# Patient Record
Sex: Female | Born: 1935
Health system: Southern US, Community
[De-identification: ages and names within clinical notes are randomized; demographics above are authoritative.]

## PROBLEM LIST (undated history)

## (undated) DIAGNOSIS — I10 Essential (primary) hypertension: Secondary | ICD-10-CM

## (undated) DIAGNOSIS — F1010 Alcohol abuse, uncomplicated: Secondary | ICD-10-CM

## (undated) DIAGNOSIS — M1990 Unspecified osteoarthritis, unspecified site: Secondary | ICD-10-CM

## (undated) DIAGNOSIS — W19XXXA Unspecified fall, initial encounter: Secondary | ICD-10-CM

## (undated) DIAGNOSIS — S2239XA Fracture of one rib, unspecified side, initial encounter for closed fracture: Secondary | ICD-10-CM

## (undated) DIAGNOSIS — IMO0001 Reserved for inherently not codable concepts without codable children: Secondary | ICD-10-CM

## (undated) DIAGNOSIS — F10121 Alcohol abuse with intoxication delirium: Secondary | ICD-10-CM

## (undated) DIAGNOSIS — J90 Pleural effusion, not elsewhere classified: Secondary | ICD-10-CM

## (undated) HISTORY — DX: Alcohol abuse, uncomplicated: F10.10

## (undated) HISTORY — DX: Fracture of one rib, unspecified side, initial encounter for closed fracture: S22.39XA

## (undated) HISTORY — DX: Unspecified fall, initial encounter: W19.XXXA

## (undated) HISTORY — PX: ABDOMINAL HYSTERECTOMY: SHX81

## (undated) HISTORY — PX: THORACENTESIS: SHX235

---

## 2012-04-23 ENCOUNTER — Emergency Department: Payer: Self-pay | Admitting: Emergency Medicine

## 2013-02-18 ENCOUNTER — Emergency Department: Payer: Self-pay | Admitting: Emergency Medicine

## 2013-02-20 ENCOUNTER — Inpatient Hospital Stay: Payer: Self-pay | Admitting: Internal Medicine

## 2013-02-20 LAB — URINALYSIS, COMPLETE
Bacteria: NONE SEEN
Bilirubin,UR: NEGATIVE
Ketone: NEGATIVE
Protein: NEGATIVE
RBC,UR: 7 /HPF (ref 0–5)
Specific Gravity: 1.002 (ref 1.003–1.030)
Squamous Epithelial: 1
WBC UR: 10 /HPF (ref 0–5)

## 2013-02-20 LAB — COMPREHENSIVE METABOLIC PANEL
Alkaline Phosphatase: 80 U/L (ref 50–136)
Anion Gap: 8 (ref 7–16)
BUN: 7 mg/dL (ref 7–18)
Bilirubin,Total: 1.1 mg/dL — ABNORMAL HIGH (ref 0.2–1.0)
Calcium, Total: 8.6 mg/dL (ref 8.5–10.1)
Co2: 26 mmol/L (ref 21–32)
Creatinine: 0.71 mg/dL (ref 0.60–1.30)
EGFR (African American): >60
EGFR (Non-African Amer.): >60
Osmolality: 249 (ref 275–301)
SGOT(AST): 53 U/L — ABNORMAL HIGH (ref 15–37)
SGPT (ALT): 37 U/L (ref 12–78)
Total Protein: 6.9 g/dL (ref 6.4–8.2)

## 2013-02-20 LAB — CBC
HCT: 30.9 % — ABNORMAL LOW (ref 35.0–47.0)
HGB: 11 g/dL — ABNORMAL LOW (ref 12.0–16.0)
MCH: 34.6 pg — ABNORMAL HIGH (ref 26.0–34.0)
MCHC: 35.5 g/dL (ref 32.0–36.0)
MCV: 97 fL (ref 80–100)
Platelet: 186 10*3/uL (ref 150–440)
RBC: 3.18 10*6/uL — ABNORMAL LOW (ref 3.80–5.20)
RDW: 12.7 % (ref 11.5–14.5)

## 2013-02-21 LAB — BASIC METABOLIC PANEL
BUN: 6 mg/dL — ABNORMAL LOW (ref 7–18)
Chloride: 103 mmol/L (ref 98–107)
EGFR (Non-African Amer.): >60
Glucose: 96 mg/dL (ref 65–99)
Osmolality: 269 (ref 275–301)
Potassium: 3.2 mmol/L — ABNORMAL LOW (ref 3.5–5.1)
Sodium: 136 mmol/L (ref 136–145)

## 2014-07-26 NOTE — H&P (Signed)
PATIENT NAME:  Lisa Chan, GUDIEL MR#:  [id number] DATE OF BIRTH:  Nov 10, 1926  DATE OF ADMISSION:  02/20/2013  PRIMARY CARE PHYSICIAN: Does not have one.   CHIEF COMPLAINT: Nausea, vomiting and altered mental status.   HISTORY OF PRESENT ILLNESS: This is an 81 year old female who presents to the Emergency Room, brought in by her daughter, due to altered mental status. As per the daughter, the patient is usually alert, awake, oriented and lives by herself. She recently fell a few days ago and had a left humeral fracture. She was discharged on some oral Percocet for the pain from the left humeral fracture. The patient developed significant nausea and vomiting shortly after she took her Percocet. Her daughter cut the Percocet in half, but her symptoms did not improve. This morning, when the daughter came to see her, she was a bit disoriented and could not recognize her. She brought her to the ER for further evaluation. The patient was noted to be acutely hyponatremic with a sodium of 124, also noted to have a urinary tract infection. Hospitalist services were contacted for further treatment and evaluation.   REVIEW OF SYSTEMS:  CONSTITUTIONAL: No documented fever. No weight gain, no weight loss.  EYES: No blurred or double vision.  ENT: No tinnitus. No postnasal drip. No redness of the oropharynx.  RESPIRATORY: No cough, no wheeze, no hemoptysis, no dyspnea.  CARDIOVASCULAR: No chest pain, no orthopnea, no palpitations, no syncope.  GASTROINTESTINAL: Positive nausea. Positive vomiting. No diarrhea. No abdominal pain. No melena or hematochezia.  GENITOURINARY: No dysuria. No hematuria.  ENDOCRINE: No polyuria or nocturia. No heat or cold intolerance.  HEMATOLOGIC: No anemia, no bruising, no bleeding.  INTEGUMENTARY: No rashes or lesions.  MUSCULOSKELETAL: No arthritis. No swelling. No gout.  NEUROLOGIC: No numbness. No tingling. No ataxia. No seizure-type activity.  PSYCHIATRIC: No anxiety, no  insomnia, no ADD.   PAST MEDICAL HISTORY: The patient has no significant past medical history.   ALLERGIES: No known drug allergies.   SOCIAL HISTORY: No smoking. Occasional alcohol use. No illicit drug abuse. Lives at home by herself.   FAMILY HISTORY: Both mother and father are deceased. Mother died from complications of heart disease. Father died from old age.   CURRENT MEDICATIONS: The patient is currently on no medications.   PHYSICAL EXAMINATION: Presently, is as follows:  VITAL SIGNS: Noted to be: Temperature is 98.4, pulse 101, respirations 18, blood pressure 140/65, saturation is 95% on room air.  GENERAL: She is a pleasant-appearing female in no apparent distress.  HEAD, EYES, EARS, NOSE AND THROAT: The patient does have a bruise above her left eye from the recent fall. Otherwise, normocephalic. Extraocular muscles are intact. Pupils are equal and reactive to light. Sclerae are anicteric. No conjunctival injection. No pharyngeal erythema.  NECK: Supple. There is no jugular venous distention. No bruits. No lymphadenopathy, no thyromegaly.  HEART: Regular rate and rhythm, tachycardic. No murmurs, no rubs, no clicks.  LUNGS: Clear to auscultation bilaterally. No rales or rhonchi. No wheezes.  ABDOMEN: Soft, flat, nontender, nondistended. Has good bowel sounds. No hepatosplenomegaly appreciated.  EXTREMITIES: No evidence of any cyanosis, clubbing or peripheral edema. Has +2 pedal and radial pulses bilaterally. The patient's left upper extremity is in a sling from a humeral fracture.  NEUROLOGICAL: She is alert, awake and oriented x3. No focal motor or sensory deficits appreciated bilaterally.  SKIN: Moist and warm with no rashes appreciated.  LYMPHATIC: There is no cervical or axillary lymphadenopathy.   LABORATORY DATA:  Shows serum glucose of 127, BUN 7, creatinine 0.7, sodium 124, potassium 3.5, chloride 90, bicarbonate 26. LFTs are within normal limits. Troponin less than 0.02.  White cell count 6.6, hemoglobin 11, hematocrit 30.9, platelet count of 186. Urinalysis shows 1+ leukocyte esterase with 10 white cells with no bacteria. The patient did have a CT of the head done without contrast which showed no acute intracranial process.   ASSESSMENT AND PLAN: This is an 81 year old female with a history of recent fall and left humeral fracture, who presents to the hospital due to altered mental status and also having nausea and vomiting now for the past 2 to 3 days.   1. Altered mental status. This is likely metabolic encephalopathy from a mild urinary tract infection and underlying hyponatremia. There is no acute neurologic source. The patient's CT head is negative. I will hydrate her with IV fluids to correct her sodium, give her IV ceftriaxone for the urinary tract infection and follow her mental status. Follow q.4 hour neuro checks. 2. Acute hyponatremia. This is likely hypovolemic hyponatremia due to the nausea and vomiting. I will hydrate her with IV fluids, follow her sodium.  3. Nausea, vomiting. This is likely related to the Percocet she was taking for her left humeral fracture. I will hold her Percocet for now and give her some Tylenol for the pain. Supportive care with IV fluids and antiemetics for now.  4. Urinary tract infection. Continue IV ceftriaxone, follow urine cultures. 5. Left humeral fracture. The patient's arm is in a sling already. The patient has outpatient followup appointment with Dr. Mack Guise this coming Friday.   CODE STATUS: The patient is a full code.   TIME SPENT ON ADMISSION: 50 minutes.   ____________________________ Belia Heman. Verdell Carmine, MD vjs:lb D: 02/20/2013 13:56:08 ET T: 02/20/2013 14:08:55 ET JOB#: [id number]  cc: Belia Heman. Verdell Carmine, MD, <Dictator> Henreitta Leber MD ELECTRONICALLY SIGNED 02/21/2013 13:49

## 2014-07-26 NOTE — Discharge Summary (Signed)
PATIENT NAME:  Lisa Chan, Lisa Chan MR#:  [id number] DATE OF BIRTH:  24-Dec-1926  DATE OF ADMISSION:  02/20/2013 DATE OF DISCHARGE:  02/21/2013  DISCHARGE DIAGNOSES:  1. Metabolic encephalopathy, likely from urinary tract infection/hyponatremia/narcotics, now back to her baseline.  2. Acute hyponatremia, likely hypovolemic hyponatremia due to nausea and vomiting, now resolved with hydration.  3. Nausea and vomiting, likely due to Percocet which is stopped now, and she is tolerating diet. 4. Urinary tract infection, uncomplicated, improving with antibiotics.  5. Left humeral fracture in sling. She will follow up with Dr. Mack Guise this coming Friday as scheduled.   SECONDARY DIAGNOSIS: None.   CONSULTATIONS: None.   PROCEDURES AND RADIOLOGY:  left shoulder x-ray on the 16th of November showed continued impacted fracture involving the anatomic neck of the humerus.    CT scan of the head without contrast on 18th of November showed no acute intracranial pathology. There is small vessel disease present.   Major laboratory panel: UA on admission showed 10 WBCs, 1+ leuk esterase, no bacteria.   HISTORY AND SHORT HOSPITAL COURSE: The patient is an 81 year old female with the above-mentioned medical problems. Was admitted for metabolic encephalopathy, thought to be secondary to UTI and/or hyponatremia and likely from Percocet. The patient's hyponatremia was thought to be due to dehydration from nausea and vomiting, likely secondary to Percocet which was prescribed for recent left humerus fracture for pain control. Please see Dr. Edward Jolly dictated history and physical for further details. The patient was doing much better. Her sodium had normalized with IV hydration. She did not have any further nausea or vomiting, and her mental status was back to baseline after stopping Percocet. On the date of discharge, her vital signs were as follows: Temperature 98.4, heart rate 93 per minute, respirations 18 per minute,  blood pressure 132/66 mmHg. She was saturating 96% on room air.   PERTINENT PHYSICAL EXAMINATION ON THE DATE OF DISCHARGE:  CARDIOVASCULAR: S1, S2 normal. No murmurs, rubs or gallops.  LUNGS: Clear to auscultation bilaterally. No wheezing, rales, rhonchi or crepitation.  ABDOMEN: Soft, benign.  NEUROLOGIC: Nonfocal examination.   All other physical examination remained at baseline.   DISCHARGE MEDICATIONS:  1. Tylenol 650 mg p.o. every 6 hours as needed.  2. Levaquin 250 mg p.o. daily for 3 days.   DISCHARGE DIET: Regular.   DISCHARGE ACTIVITY: As tolerated.   DISCHARGE INSTRUCTIONS AND FOLLOWUP: The patient was instructed to follow up with a new primary care physician at [facility] in [place] if needed in 1 to 2 weeks. She will need followup with Dr. Mack Guise from orthopedics on this Friday as scheduled on November 21st.   TOTAL TIME DISCHARGING THIS PATIENT: 45 minutes.    ____________________________ Lucina Mellow. Manuella Ghazi, MD vss:gb D: 02/21/2013 21:30:04 ET T: 02/21/2013 22:26:46 ET JOB#: [id number]  cc: Ajanay Farve S. Manuella Ghazi, MD, <Dictator> [facility] Timoteo Gaul, MD Lucina Mellow [facility] MD ELECTRONICALLY SIGNED 02/22/2013 16:24

## 2015-10-03 ENCOUNTER — Emergency Department
Admission: EM | Admit: 2015-10-03 | Discharge: 2015-10-04 | Payer: Medicare PPO | Attending: Emergency Medicine | Admitting: Emergency Medicine

## 2015-10-03 ENCOUNTER — Inpatient Hospital Stay: Admit: 2015-10-03 | Payer: Self-pay

## 2015-10-03 ENCOUNTER — Emergency Department: Payer: Medicare PPO

## 2015-10-03 ENCOUNTER — Encounter: Payer: Self-pay | Admitting: Emergency Medicine

## 2015-10-03 DIAGNOSIS — Y999 Unspecified external cause status: Secondary | ICD-10-CM | POA: Diagnosis not present

## 2015-10-03 DIAGNOSIS — W010XXA Fall on same level from slipping, tripping and stumbling without subsequent striking against object, initial encounter: Secondary | ICD-10-CM | POA: Diagnosis not present

## 2015-10-03 DIAGNOSIS — S270XXA Traumatic pneumothorax, initial encounter: Secondary | ICD-10-CM | POA: Diagnosis not present

## 2015-10-03 DIAGNOSIS — M1990 Unspecified osteoarthritis, unspecified site: Secondary | ICD-10-CM | POA: Diagnosis not present

## 2015-10-03 DIAGNOSIS — S299XXA Unspecified injury of thorax, initial encounter: Secondary | ICD-10-CM | POA: Diagnosis not present

## 2015-10-03 DIAGNOSIS — R51 Headache: Secondary | ICD-10-CM | POA: Insufficient documentation

## 2015-10-03 DIAGNOSIS — J939 Pneumothorax, unspecified: Secondary | ICD-10-CM | POA: Insufficient documentation

## 2015-10-03 DIAGNOSIS — S2242XA Multiple fractures of ribs, left side, initial encounter for closed fracture: Secondary | ICD-10-CM | POA: Diagnosis not present

## 2015-10-03 DIAGNOSIS — S3991XA Unspecified injury of abdomen, initial encounter: Secondary | ICD-10-CM | POA: Diagnosis not present

## 2015-10-03 DIAGNOSIS — Y92009 Unspecified place in unspecified non-institutional (private) residence as the place of occurrence of the external cause: Secondary | ICD-10-CM | POA: Insufficient documentation

## 2015-10-03 DIAGNOSIS — M549 Dorsalgia, unspecified: Secondary | ICD-10-CM | POA: Diagnosis not present

## 2015-10-03 DIAGNOSIS — S2232XA Fracture of one rib, left side, initial encounter for closed fracture: Secondary | ICD-10-CM

## 2015-10-03 DIAGNOSIS — Y939 Activity, unspecified: Secondary | ICD-10-CM | POA: Diagnosis not present

## 2015-10-03 DIAGNOSIS — R109 Unspecified abdominal pain: Secondary | ICD-10-CM | POA: Insufficient documentation

## 2015-10-03 DIAGNOSIS — W19XXXA Unspecified fall, initial encounter: Secondary | ICD-10-CM

## 2015-10-03 DIAGNOSIS — S0990XA Unspecified injury of head, initial encounter: Secondary | ICD-10-CM | POA: Diagnosis not present

## 2015-10-03 HISTORY — DX: Unspecified osteoarthritis, unspecified site: M19.90

## 2015-10-03 LAB — BASIC METABOLIC PANEL
Anion gap: 15 (ref 5–15)
BUN: 7 mg/dL (ref 6–20)
CO2: 24 mmol/L (ref 22–32)
CREATININE: 0.54 mg/dL (ref 0.44–1.00)
Calcium: 8.8 mg/dL — ABNORMAL LOW (ref 8.9–10.3)
Chloride: 94 mmol/L — ABNORMAL LOW (ref 101–111)
Glucose, Bld: 143 mg/dL — ABNORMAL HIGH (ref 65–99)
Potassium: 3.6 mmol/L (ref 3.5–5.1)
SODIUM: 133 mmol/L — AB (ref 135–145)

## 2015-10-03 LAB — CBC WITH DIFFERENTIAL/PLATELET
BASOS PCT: 0 %
Basophils Absolute: 0 10*3/uL (ref 0–0.1)
EOS ABS: 0 10*3/uL (ref 0–0.7)
EOS PCT: 0 %
HCT: 35.1 % (ref 35.0–47.0)
Hemoglobin: 12.2 g/dL (ref 12.0–16.0)
LYMPHS ABS: 0.7 10*3/uL — AB (ref 1.0–3.6)
Lymphocytes Relative: 7 %
MCH: 35.3 pg — AB (ref 26.0–34.0)
MCHC: 34.6 g/dL (ref 32.0–36.0)
MCV: 102.1 fL — ABNORMAL HIGH (ref 80.0–100.0)
MONOS PCT: 6 %
Monocytes Absolute: 0.6 10*3/uL (ref 0.2–0.9)
NEUTROS ABS: 8.9 10*3/uL — AB (ref 1.4–6.5)
NEUTROS PCT: 87 %
PLATELETS: 239 10*3/uL (ref 150–440)
RBC: 3.44 MIL/uL — AB (ref 3.80–5.20)
RDW: 14.5 % (ref 11.5–14.5)
WBC: 10.1 10*3/uL (ref 3.6–11.0)

## 2015-10-03 LAB — TROPONIN I: Troponin I: <0.03 ng/mL (ref <0.03)

## 2015-10-03 MED ORDER — MORPHINE SULFATE (PF) 2 MG/ML IV SOLN
2.0000 mg | Freq: Once | INTRAVENOUS | Status: AC
  Administered 2015-10-03: 2 mg via INTRAVENOUS

## 2015-10-03 MED ORDER — MORPHINE SULFATE (PF) 2 MG/ML IV SOLN
INTRAVENOUS | Status: AC
  Filled 2015-10-03: qty 1

## 2015-10-03 MED ORDER — SODIUM CHLORIDE 0.9 % IV BOLUS (SEPSIS)
500.0000 mL | Freq: Once | INTRAVENOUS | Status: AC
  Administered 2015-10-03: 500 mL via INTRAVENOUS

## 2015-10-03 MED ORDER — MORPHINE SULFATE (PF) 2 MG/ML IV SOLN
INTRAVENOUS | Status: AC
  Administered 2015-10-03: 2 mg via INTRAVENOUS
  Filled 2015-10-03: qty 1

## 2015-10-03 MED ORDER — MORPHINE SULFATE (PF) 2 MG/ML IV SOLN: 1.0000 mg | Freq: Once | INTRAVENOUS | Status: DC

## 2015-10-03 MED ORDER — LIDOCAINE-EPINEPHRINE (PF) 1 %-1:200000 IJ SOLN
INTRAMUSCULAR | Status: AC
  Filled 2015-10-03: qty 30

## 2015-10-03 MED ORDER — IOPAMIDOL (ISOVUE-370) INJECTION 76%
75.0000 mL | Freq: Once | INTRAVENOUS | Status: AC | PRN
  Administered 2015-10-03: 75 mL via INTRAVENOUS

## 2015-10-03 NOTE — ED Provider Notes (Signed)
[facility] Emergency Department Provider Note    ____________________________________________  Time seen: ~2140  I have reviewed the triage vital signs and the nursing notes.   HISTORY  Chief Complaint Fall   History limited by: Not Limited   HPI Lisa Chan is a 82 y.o. female presents from home today after a fall. Family states that the patient does live alone. She has had frequent falls recently. It is unclear the etiology of these falls. The patient will not say whether she thinks she trips or this is her bowels. She simply states that is because she is getting old. Does sound like the patient has a lot of furniture around the house that she uses for balance. She does not have a cane or walker. Patient denied passing out with today's fall. She denied any concurrent chest pain palpitations or shortness of breath. Patient's primary complaint is of left thorax pain. Family did note a new bruise to that area. Family also points out bruises to the right shoulder and right upper chest which were from a previous fall.    Past Medical History  Diagnosis Date  . Arthritis     There are no active problems to display for this patient.   Past Surgical History  Procedure Laterality Date  . Abdominal hysterectomy      No current outpatient prescriptions on file.  Allergies Review of patient's allergies indicates no known allergies.  History reviewed. No pertinent family history.  Social History Social History  Substance Use Topics  . Smoking status: Never Smoker   . Smokeless tobacco: None  . Alcohol Use: 12.6 oz/week    21 Shots of liquor per week    Review of Systems  Constitutional: Negative for fever. Cardiovascular: Negative for chest pain. Respiratory: Negative for shortness of breath. Gastrointestinal: Negative for abdominal pain, vomiting and diarrhea. Genitourinary: Negative for dysuria. Musculoskeletal: Negative for back pain.  Positive for left thorax pain. Skin: Negative for rash. Neurological: Negative for headaches, focal weakness or numbness.  10-point ROS otherwise negative.  ____________________________________________   PHYSICAL EXAM:  VITAL SIGNS: ED Triage Vitals  Enc Vitals Group     BP 10/03/15 2100 175/100 mmHg     Pulse Rate 10/03/15 2100 108     Resp 10/03/15 2100 18     Temp 10/03/15 2100 97.7 F (36.5 C)     Temp Source 10/03/15 2100 Oral     SpO2 10/03/15 2100 93 %   Constitutional: Alert and oriented. Well appearing and in no distress. Thin. Eyes: Conjunctivae are normal. PERRL. Normal extraocular movements. ENT   Head: Normocephalic and atraumatic.   Nose: No congestion/rhinnorhea.   Mouth/Throat: Mucous membranes are moist.   Neck: No stridor. Hematological/Lymphatic/Immunilogical: No cervical lymphadenopathy. Cardiovascular: Normal rate, regular rhythm.  No murmurs, rubs, or gallops. Left chest wall tender to palpitation.  Respiratory: Normal respiratory effort without tachypnea nor retractions. Breath sounds are clear and equal bilaterally. No wheezes/rales/rhonchi. Gastrointestinal: Soft and nontender. No distention. There is no CVA tenderness. Genitourinary: Deferred Musculoskeletal: Normal range of motion in all extremities. No spinal tenderness. Hips without tenderness. Pelvis stable. No joint effusions.  No lower extremity tenderness nor edema. Neurologic:  Normal speech and language. No gross focal neurologic deficits are appreciated.  Skin:  Older appearing ecchymosis over the right shoulder and upper chest. Ecchymosis to the left thorax.  Psychiatric: Mood and affect are normal. Speech and behavior are normal. Patient exhibits appropriate insight and judgment.  ____________________________________________  LABS (pertinent positives/negatives)  Labs Reviewed  CBC WITH DIFFERENTIAL/PLATELET - Abnormal; Notable for the following:    RBC 3.44 (*)    MCV  102.1 (*)    MCH 35.3 (*)    Neutro Abs 8.9 (*)    Lymphs Abs 0.7 (*)    All other components within normal limits  BASIC METABOLIC PANEL - Abnormal; Notable for the following:    Sodium 133 (*)    Chloride 94 (*)    Glucose, Bld 143 (*)    Calcium 8.8 (*)    All other components within normal limits  TROPONIN I  URINALYSIS COMPLETEWITH MICROSCOPIC ([facility] ONLY)     ____________________________________________   EKG  None  ____________________________________________    RADIOLOGY  CXR IMPRESSION: 1. Left pneumothorax, approximately 20% by volume. 2. Extensive left subcutaneous emphysema. 3. Numerous left rib fractures, displaced and overriding.  CT chest/abd/pel IMPRESSION: 1. Acute displaced fractures of the left second through eighth ribs. Moderate left pneumothorax, 15-20% by volume. 2. Small left pleural effusion and left lower lobe atelectasis or contusion. 3. No acute traumatic injury in the abdomen or pelvis. 4. Moderate compression of L1, indeterminate age but more likely remote. 5. Chronic appearing compressions of T9 and L5. Remote healed left pubic ramus fracture deformities. Remote posttraumatic deformities of the proximal humerus bilaterally, incompletely united on the Left.  CT head IMPRESSION: No acute intracranial hemorrhage.  Age-related atrophy and chronic microvascular ischemic disease.   ____________________________________________   PROCEDURES  Procedure(s) performed: None  Critical Care performed: No  ____________________________________________   INITIAL IMPRESSION / ASSESSMENT AND PLAN / ED COURSE  Pertinent labs & imaging results that were available during my care of the patient were reviewed by me and considered in my medical decision making (see chart for details).  Patient presented to the emergency department today after a fall. Patient complained primarily of left-sided rib pain. Chest x-ray does show a left  pneumothorax with multiple rib fractures. I did have surgery evaluate the patient. Recommended CT of the chest and abdomen which was performed. This again showed the multiple rib fractures and pneumothorax without any other significant traumatic injury. Given the number of rib fractures and pneumothorax hour the patient was arranged to be transferred to [facility] so she can be under the care of trauma surgery. Prior to transport Dr. Rosalia Hammers with surgery placed a chest tube.  ____________________________________________   FINAL CLINICAL IMPRESSION(S) / ED DIAGNOSES  Final diagnoses:  Fall, initial encounter  Left rib fracture, closed, initial encounter  Pneumothorax     Note: This dictation was prepared with Dragon dictation. Any transcriptional errors that result from this process are unintentional    Nance Pear, MD 10/04/15 [phone]

## 2015-10-03 NOTE — ED Notes (Signed)
Gave report to [facility], they are 5 minutes out.

## 2015-10-03 NOTE — H&P (Signed)
Patient ID: Lisa Chan, female   DOB: 1926/12/05, 82 y.o.   MRN: [id number]  History of Present Illness Lisa Chan is a 82 y.o. female with a history of two falls earlier today, from standing, landing on her back and left flank.  She denies LOC.  She reports she tripped on the carpet, mechanical fall.  Denies any presyncopal symptoms.  Complains now of back and left flank pain.  Denies SOB, denies CP.  Denies abdominal pain.  Not on any blood thinners.  Lives home alone in a single level home.    Past Medical History Past Medical History  Diagnosis Date  . Arthritis        Past Surgical History  Procedure Laterality Date  . Abdominal hysterectomy      No Known Allergies  Current Facility-Administered Medications  Medication Dose Route Frequency Provider Last Rate Last Dose  . sodium chloride 0.9 % bolus 500 mL  500 mL Intravenous Once Nance Pear, MD       No current outpatient prescriptions on file.    Family History History reviewed. No pertinent family history.     Social History Social History  Substance Use Topics  . Smoking status: Never Smoker   . Smokeless tobacco: None  . Alcohol Use: 12.6 oz/week    21 Shots of liquor per week        ROS As per HPI otherwise 12 pt ROS is negative  Physical Exam Blood pressure 175/100, pulse 108, temperature 97.7 F (36.5 C), temperature source Oral, resp. rate 18, SpO2 93 %.  CONSTITUTIONAL: alert and oriented, NAD EYES: Pupils equal, round, and reactive to light, Sclera non-icteric. EARS, NOSE, MOUTH AND THROAT: The oropharynx is clear. Oral mucosa is pink and moist. Hearing is intact to voice.  NECK: Trachea is midline, and there is no jugular venous distension. Thyroid is without palpable abnormalities. LYMPH NODES:  Lymph nodes in the neck are not enlarged. RESPIRATORY:  Lungs are clear, and breath sounds are diminished on the elft anteriorly. Normal respiratory effort without pathologic use of accessory  muscles. CARDIOVASCULAR: Heart is regular without murmurs, gallops, or rubs.  TTP left chest wall GI: The abdomen is  soft, nontender, and nondistended. There were no palpable masses. There was no hepatosplenomegaly. There were normal bowel sounds. GU: Deferred MUSCULOSKELETAL:  Normal muscle strength and tone in all four extremities.    SKIN: Skin turgor is normal. There are no pathologic skin lesions. Crepitus on left chest wall NEUROLOGIC:  Motor and sensation is grossly normal.  Cranial nerves are grossly intact. PSYCH:  Alert and oriented to person, place and time. Affect is normal.  Data Reviewed CXR, CT C/A/P: 5 left sided rib fractures, markedly displaced, moderate pneumothorax, moderate hemothorax, left pulmonary contusion.  Final read pending, on my read no obvious intraabdominal injury, no obvious splenic laceration or hemoperitoneum  I have personally reviewed the patient's imaging and medical records.    Assessment    69 F with significant left chest wall traumatic injury with markedly displaced rib fractures and mixed hemo/pneumothorax  Plan   Patient currently without respiratory distress, O2 saturation in upper 90s on 4LNC.  Due to the significant risk of pulmonary decline in setting of pulmonary contusion, with patients advanced age and minimal physiologic reserve, with this traumatic injury, recommending transfer to a trauma center for further evaluation and management.  Will hold off on chest tube at this time as injury was ~6 hrs ago and is not having  respiratory decompensation at this time  Face-to-face time spent with the patient and care providers was 30 minutes, with more than 50% of the time spent counseling, educating, and coordinating care of the patient.     Mali Jourdan Durbin 10/03/2015, 11:06 PM

## 2015-10-03 NOTE — ED Notes (Signed)
Per EMS, patient had unwitnessed fall at home on left side.  Patient has contusion on left side of her back that she says is there because of her fall.  She also has bruising on right side of body that goes front to back.  She has no memory of how that happened.  Tonight she presents in NAD and is AOx4.  Patient denies weakness, dizziness or LOC as related to her fall.

## 2015-10-03 NOTE — ED Notes (Signed)
Patient consistently in mid 80's O2 saturation, putting on 4L nasal cannula, she went to 96%.

## 2015-10-04 ENCOUNTER — Inpatient Hospital Stay (HOSPITAL_COMMUNITY): Payer: Medicare PPO

## 2015-10-04 ENCOUNTER — Inpatient Hospital Stay (HOSPITAL_COMMUNITY)
Admission: AD | Admit: 2015-10-04 | Discharge: 2015-10-12 | DRG: 200 | Disposition: A | Discharge status: Home Health | Payer: Medicare PPO | Source: Other Acute Inpatient Hospital | Attending: General Surgery | Admitting: General Surgery

## 2015-10-04 ENCOUNTER — Emergency Department: Payer: Medicare PPO

## 2015-10-04 DIAGNOSIS — S2242XA Multiple fractures of ribs, left side, initial encounter for closed fracture: Secondary | ICD-10-CM | POA: Diagnosis not present

## 2015-10-04 DIAGNOSIS — J939 Pneumothorax, unspecified: Secondary | ICD-10-CM

## 2015-10-04 DIAGNOSIS — M1990 Unspecified osteoarthritis, unspecified site: Secondary | ICD-10-CM | POA: Diagnosis present

## 2015-10-04 DIAGNOSIS — I4891 Unspecified atrial fibrillation: Secondary | ICD-10-CM

## 2015-10-04 DIAGNOSIS — S51011A Laceration without foreign body of right elbow, initial encounter: Secondary | ICD-10-CM | POA: Diagnosis not present

## 2015-10-04 DIAGNOSIS — L989 Disorder of the skin and subcutaneous tissue, unspecified: Secondary | ICD-10-CM | POA: Diagnosis present

## 2015-10-04 DIAGNOSIS — E039 Hypothyroidism, unspecified: Secondary | ICD-10-CM | POA: Diagnosis present

## 2015-10-04 DIAGNOSIS — Z681 Body mass index (BMI) 19 or less, adult: Secondary | ICD-10-CM | POA: Diagnosis not present

## 2015-10-04 DIAGNOSIS — W19XXXA Unspecified fall, initial encounter: Secondary | ICD-10-CM | POA: Diagnosis present

## 2015-10-04 DIAGNOSIS — Z781 Physical restraint status: Secondary | ICD-10-CM | POA: Diagnosis not present

## 2015-10-04 DIAGNOSIS — E876 Hypokalemia: Secondary | ICD-10-CM | POA: Diagnosis not present

## 2015-10-04 DIAGNOSIS — S2242XG Multiple fractures of ribs, left side, subsequent encounter for fracture with delayed healing: Secondary | ICD-10-CM

## 2015-10-04 DIAGNOSIS — R2231 Localized swelling, mass and lump, right upper limb: Secondary | ICD-10-CM | POA: Diagnosis present

## 2015-10-04 DIAGNOSIS — W010XXA Fall on same level from slipping, tripping and stumbling without subsequent striking against object, initial encounter: Secondary | ICD-10-CM | POA: Diagnosis present

## 2015-10-04 DIAGNOSIS — S0990XA Unspecified injury of head, initial encounter: Secondary | ICD-10-CM | POA: Diagnosis not present

## 2015-10-04 DIAGNOSIS — I491 Atrial premature depolarization: Secondary | ICD-10-CM | POA: Diagnosis present

## 2015-10-04 DIAGNOSIS — R109 Unspecified abdominal pain: Secondary | ICD-10-CM | POA: Diagnosis not present

## 2015-10-04 DIAGNOSIS — S2249XA Multiple fractures of ribs, unspecified side, initial encounter for closed fracture: Secondary | ICD-10-CM | POA: Diagnosis present

## 2015-10-04 DIAGNOSIS — D62 Acute posthemorrhagic anemia: Secondary | ICD-10-CM | POA: Diagnosis not present

## 2015-10-04 DIAGNOSIS — S272XXA Traumatic hemopneumothorax, initial encounter: Secondary | ICD-10-CM | POA: Diagnosis present

## 2015-10-04 DIAGNOSIS — S2249XS Multiple fractures of ribs, unspecified side, sequela: Secondary | ICD-10-CM | POA: Diagnosis not present

## 2015-10-04 DIAGNOSIS — Z9689 Presence of other specified functional implants: Secondary | ICD-10-CM

## 2015-10-04 DIAGNOSIS — R1310 Dysphagia, unspecified: Secondary | ICD-10-CM | POA: Diagnosis not present

## 2015-10-04 DIAGNOSIS — R0781 Pleurodynia: Secondary | ICD-10-CM | POA: Diagnosis present

## 2015-10-04 DIAGNOSIS — T797XXA Traumatic subcutaneous emphysema, initial encounter: Secondary | ICD-10-CM | POA: Diagnosis not present

## 2015-10-04 DIAGNOSIS — Y92009 Unspecified place in unspecified non-institutional (private) residence as the place of occurrence of the external cause: Secondary | ICD-10-CM | POA: Diagnosis not present

## 2015-10-04 DIAGNOSIS — J9811 Atelectasis: Secondary | ICD-10-CM | POA: Diagnosis not present

## 2015-10-04 DIAGNOSIS — E440 Moderate protein-calorie malnutrition: Secondary | ICD-10-CM | POA: Diagnosis present

## 2015-10-04 DIAGNOSIS — I471 Supraventricular tachycardia: Secondary | ICD-10-CM | POA: Diagnosis not present

## 2015-10-04 DIAGNOSIS — I480 Paroxysmal atrial fibrillation: Secondary | ICD-10-CM | POA: Diagnosis not present

## 2015-10-04 DIAGNOSIS — Z66 Do not resuscitate: Secondary | ICD-10-CM | POA: Diagnosis present

## 2015-10-04 DIAGNOSIS — F1019 Alcohol abuse with unspecified alcohol-induced disorder: Secondary | ICD-10-CM | POA: Diagnosis not present

## 2015-10-04 DIAGNOSIS — W19XXXD Unspecified fall, subsequent encounter: Secondary | ICD-10-CM

## 2015-10-04 DIAGNOSIS — S2249XG Multiple fractures of ribs, unspecified side, subsequent encounter for fracture with delayed healing: Secondary | ICD-10-CM

## 2015-10-04 DIAGNOSIS — R269 Unspecified abnormalities of gait and mobility: Secondary | ICD-10-CM | POA: Diagnosis not present

## 2015-10-04 DIAGNOSIS — R000 Tachycardia, unspecified: Secondary | ICD-10-CM | POA: Diagnosis not present

## 2015-10-04 DIAGNOSIS — R410 Disorientation, unspecified: Secondary | ICD-10-CM | POA: Diagnosis not present

## 2015-10-04 DIAGNOSIS — F1010 Alcohol abuse, uncomplicated: Secondary | ICD-10-CM | POA: Diagnosis not present

## 2015-10-04 DIAGNOSIS — S270XXA Traumatic pneumothorax, initial encounter: Secondary | ICD-10-CM | POA: Diagnosis not present

## 2015-10-04 DIAGNOSIS — R51 Headache: Secondary | ICD-10-CM | POA: Diagnosis not present

## 2015-10-04 DIAGNOSIS — S299XXA Unspecified injury of thorax, initial encounter: Secondary | ICD-10-CM

## 2015-10-04 DIAGNOSIS — S2231XA Fracture of one rib, right side, initial encounter for closed fracture: Secondary | ICD-10-CM | POA: Diagnosis not present

## 2015-10-04 LAB — BASIC METABOLIC PANEL
Anion gap: 12 (ref 5–15)
CHLORIDE: 95 mmol/L — AB (ref 101–111)
CO2: 25 mmol/L (ref 22–32)
CREATININE: 0.57 mg/dL (ref 0.44–1.00)
Calcium: 8.1 mg/dL — ABNORMAL LOW (ref 8.9–10.3)
GFR calc Af Amer: >60 mL/min (ref >60)
GFR calc non Af Amer: >60 mL/min (ref >60)
GLUCOSE: 132 mg/dL — AB (ref 65–99)
Potassium: 3.8 mmol/L (ref 3.5–5.1)
Sodium: 132 mmol/L — ABNORMAL LOW (ref 135–145)

## 2015-10-04 LAB — URINALYSIS COMPLETE WITH MICROSCOPIC (ARMC ONLY)
BILIRUBIN URINE: NEGATIVE
Bacteria, UA: NONE SEEN
GLUCOSE, UA: NEGATIVE mg/dL
Leukocytes, UA: NEGATIVE
NITRITE: NEGATIVE
Protein, ur: NEGATIVE mg/dL
SPECIFIC GRAVITY, URINE: 1.026 (ref 1.005–1.030)
pH: 6 (ref 5.0–8.0)

## 2015-10-04 LAB — CBC
HCT: 29.7 % — ABNORMAL LOW (ref 36.0–46.0)
Hemoglobin: 9.9 g/dL — ABNORMAL LOW (ref 12.0–15.0)
MCH: 33.9 pg (ref 26.0–34.0)
MCHC: 33.3 g/dL (ref 30.0–36.0)
MCV: 101.7 fL — AB (ref 78.0–100.0)
PLATELETS: 194 10*3/uL (ref 150–400)
RBC: 2.92 MIL/uL — ABNORMAL LOW (ref 3.87–5.11)
RDW: 13.5 % (ref 11.5–15.5)
WBC: 7.1 10*3/uL (ref 4.0–10.5)

## 2015-10-04 LAB — MRSA PCR SCREENING: MRSA by PCR: NEGATIVE

## 2015-10-04 MED ORDER — LORAZEPAM 1 MG PO TABS
0.0000 mg | ORAL_TABLET | Freq: Two times a day (BID) | ORAL | Status: DC
Start: 2015-10-06 — End: 2015-10-07

## 2015-10-04 MED ORDER — ONDANSETRON HCL 4 MG/2ML IJ SOLN
4.0000 mg | Freq: Four times a day (QID) | INTRAMUSCULAR | Status: DC | PRN
  Administered 2015-10-04: 4 mg via INTRAVENOUS
  Filled 2015-10-04: qty 2

## 2015-10-04 MED ORDER — ONDANSETRON HCL 4 MG PO TABS: 4.0000 mg | ORAL_TABLET | Freq: Four times a day (QID) | ORAL | Status: DC | PRN

## 2015-10-04 MED ORDER — LORAZEPAM 1 MG PO TABS
0.0000 mg | ORAL_TABLET | Freq: Four times a day (QID) | ORAL | Status: AC
  Administered 2015-10-04 – 2015-10-05 (×6): 1 mg via ORAL
  Filled 2015-10-04 (×5): qty 1
  Filled 2015-10-04: qty 2

## 2015-10-04 MED ORDER — LIDOCAINE HCL (PF) 1 % IJ SOLN
INTRAMUSCULAR | Status: AC
  Filled 2015-10-04: qty 5

## 2015-10-04 MED ORDER — FENTANYL CITRATE (PF) 100 MCG/2ML IJ SOLN
INTRAMUSCULAR | Status: AC
  Filled 2015-10-04: qty 2

## 2015-10-04 MED ORDER — LIDOCAINE HCL (PF) 1 % IJ SOLN
INTRAMUSCULAR | Status: AC
  Filled 2015-10-04: qty 30

## 2015-10-04 MED ORDER — ACETAMINOPHEN 325 MG PO TABS
650.0000 mg | ORAL_TABLET | Freq: Four times a day (QID) | ORAL | Status: DC | PRN
  Administered 2015-10-07 – 2015-10-08 (×2): 650 mg via ORAL
  Filled 2015-10-04 (×2): qty 2

## 2015-10-04 MED ORDER — ENOXAPARIN SODIUM 40 MG/0.4ML ~~LOC~~ SOLN
40.0000 mg | SUBCUTANEOUS | Status: DC
  Administered 2015-10-04 – 2015-10-12 (×9): 40 mg via SUBCUTANEOUS
  Filled 2015-10-04 (×9): qty 0.4

## 2015-10-04 MED ORDER — LORAZEPAM 1 MG PO TABS: 1.0000 mg | ORAL_TABLET | Freq: Four times a day (QID) | ORAL | Status: DC | PRN

## 2015-10-04 MED ORDER — THIAMINE HCL 100 MG/ML IJ SOLN
100.0000 mg | Freq: Every day | INTRAMUSCULAR | Status: DC
  Administered 2015-10-04: 100 mg via INTRAVENOUS
  Filled 2015-10-04: qty 2

## 2015-10-04 MED ORDER — FOLIC ACID 1 MG PO TABS
1.0000 mg | ORAL_TABLET | Freq: Every day | ORAL | Status: DC
  Administered 2015-10-04 – 2015-10-12 (×9): 1 mg via ORAL
  Filled 2015-10-04 (×9): qty 1

## 2015-10-04 MED ORDER — FENTANYL CITRATE (PF) 100 MCG/2ML IJ SOLN
50.0000 ug | Freq: Once | INTRAMUSCULAR | Status: AC
  Administered 2015-10-04: 50 ug via INTRAVENOUS

## 2015-10-04 MED ORDER — TRAMADOL HCL 50 MG PO TABS
50.0000 mg | ORAL_TABLET | Freq: Four times a day (QID) | ORAL | Status: DC | PRN
  Administered 2015-10-05 – 2015-10-07 (×4): 50 mg via ORAL
  Filled 2015-10-04 (×4): qty 1

## 2015-10-04 MED ORDER — LORAZEPAM 2 MG/ML IJ SOLN: 1.0000 mg | Freq: Four times a day (QID) | INTRAMUSCULAR | Status: DC | PRN

## 2015-10-04 MED ORDER — MIDAZOLAM HCL 2 MG/2ML IJ SOLN
2.0000 mg | Freq: Once | INTRAMUSCULAR | Status: AC
  Administered 2015-10-04: 2 mg via INTRAVENOUS

## 2015-10-04 MED ORDER — VITAMIN B-1 100 MG PO TABS
100.0000 mg | ORAL_TABLET | Freq: Every day | ORAL | Status: DC
  Administered 2015-10-05 – 2015-10-12 (×8): 100 mg via ORAL
  Filled 2015-10-04 (×7): qty 1

## 2015-10-04 MED ORDER — FENTANYL CITRATE (PF) 100 MCG/2ML IJ SOLN
12.5000 ug | INTRAMUSCULAR | Status: DC | PRN
  Administered 2015-10-04 – 2015-10-07 (×7): 25 ug via INTRAVENOUS
  Filled 2015-10-04 (×7): qty 2

## 2015-10-04 MED ORDER — MIDAZOLAM HCL 2 MG/2ML IJ SOLN
INTRAMUSCULAR | Status: AC
  Filled 2015-10-04: qty 2

## 2015-10-04 MED ORDER — ADULT MULTIVITAMIN W/MINERALS CH
1.0000 | ORAL_TABLET | Freq: Every day | ORAL | Status: DC
  Administered 2015-10-04 – 2015-10-12 (×9): 1 via ORAL
  Filled 2015-10-04 (×9): qty 1

## 2015-10-04 MED ORDER — POTASSIUM CHLORIDE IN NACL 20-0.9 MEQ/L-% IV SOLN
INTRAVENOUS | Status: DC
  Administered 2015-10-04 – 2015-10-08 (×2): via INTRAVENOUS
  Administered 2015-10-08: 1000 mL via INTRAVENOUS
  Administered 2015-10-09: 75 mL/h via INTRAVENOUS
  Administered 2015-10-09: 22:00:00 via INTRAVENOUS
  Filled 2015-10-04 (×10): qty 1000

## 2015-10-04 NOTE — Progress Notes (Signed)
Daughter present and visiting with patient.  This nurse discussed with daughter concerns re:  Patient's safety at home given the recent history of multiple falls with injury in the home environment.  It is not known if alcohol was a factor in any of these falls.   Daughter appears to "brush off" past injuries and incidences and does not wish to discuss option of Assisted Living versus other options where patient would not be alone much of the time.  She does admit that her mother does "appear more depressed lately".  Patient was given Ativan at 1100 h. Due to increased restlessness, agitation, diaphoresis, and disorientation that was out of norm for her.  She is now comfortable and easily arousable, though slips back to sleep easily.

## 2015-10-04 NOTE — H&P (Signed)
Lisa Chan is an 82 y.o. female.   Chief Complaint: left rib pain HPI: Lisa Chan tripped over a rug at home and fell striking her left chest wall. She was evaluated at [facility] emergency department. She was found to have left rib fractures 2-8 with associated pneumothorax. I accepted her in transfer to [facility]. Prior to transfer, a left chest tube was placed by the surgeon  There. Lisa Chan C/O L chest pain and dry mouth. Her daughter is hwere and reports that Lisa Chan has been drinking at least 4 vodka drinks daily.  Past Medical History  Diagnosis Date  . Arthritis     Past Surgical History  Procedure Laterality Date  . Abdominal hysterectomy      No family history on file. Social History:  reports that she has never smoked. She does not have any smokeless tobacco history on file. She reports that she drinks about 12.6 oz of alcohol per week. Her drug history is not on file.  Allergies: No Known Allergies  No prescriptions prior to admission    Results for orders placed or performed during the hospital encounter of 10/03/15 (from the past 48 hour(s))  CBC with Differential     Status: Abnormal   Collection Time: 10/03/15  9:49 PM  Result Value Ref Range   WBC 10.1 3.6 - 11.0 K/uL   RBC 3.44 (L) 3.80 - 5.20 MIL/uL   Hemoglobin 12.2 12.0 - 16.0 g/dL   HCT 35.1 35.0 - 47.0 %   MCV 102.1 (H) 80.0 - 100.0 fL   MCH 35.3 (H) 26.0 - 34.0 pg   MCHC 34.6 32.0 - 36.0 g/dL   RDW 14.5 11.5 - 14.5 %   Platelets 239 150 - 440 K/uL   Neutrophils Relative % 87 %   Neutro Abs 8.9 (H) 1.4 - 6.5 K/uL   Lymphocytes Relative 7 %   Lymphs Abs 0.7 (L) 1.0 - 3.6 K/uL   Monocytes Relative 6 %   Monocytes Absolute 0.6 0.2 - 0.9 K/uL   Eosinophils Relative 0 %   Eosinophils Absolute 0.0 0 - 0.7 K/uL   Basophils Relative 0 %   Basophils Absolute 0.0 0 - 0.1 K/uL  Basic metabolic panel     Status: Abnormal   Collection Time: 10/03/15  9:49 PM  Result Value Ref Range   Sodium 133 (L)  135 - 145 mmol/L   Potassium 3.6 3.5 - 5.1 mmol/L   Chloride 94 (L) 101 - 111 mmol/L   CO2 24 22 - 32 mmol/L   Glucose, Bld 143 (H) 65 - 99 mg/dL   BUN 7 6 - 20 mg/dL   Creatinine, Ser 0.54 0.44 - 1.00 mg/dL   Calcium 8.8 (L) 8.9 - 10.3 mg/dL   GFR calc non Af Amer >60 >60 mL/min   GFR calc Af Amer >60 >60 mL/min    Comment: (NOTE) The eGFR has been calculated using the CKD EPI equation. This calculation has not been validated in all clinical situations. eGFR's persistently <60 mL/min signify possible Chronic Kidney Disease.    Anion gap 15 5 - 15  Troponin I     Status: None   Collection Time: 10/03/15  9:49 PM  Result Value Ref Range   Troponin I <0.03 <0.03 ng/mL  Urinalysis complete, with microscopic ([facility] only)     Status: Abnormal   Collection Time: 10/04/15 12:32 AM  Result Value Ref Range   Color, Urine COLORLESS (A) YELLOW   APPearance CLEAR (A)  CLEAR   Glucose, UA NEGATIVE NEGATIVE mg/dL   Bilirubin Urine NEGATIVE NEGATIVE   Ketones, ur TRACE (A) NEGATIVE mg/dL   Specific Gravity, Urine 1.026 1.005 - 1.030   Hgb urine dipstick 2+ (A) NEGATIVE   pH 6.0 5.0 - 8.0   Protein, ur NEGATIVE NEGATIVE mg/dL   Nitrite NEGATIVE NEGATIVE   Leukocytes, UA NEGATIVE NEGATIVE   RBC / HPF 6-30 0 - 5 RBC/hpf   WBC, UA 0-5 0 - 5 WBC/hpf   Bacteria, UA NONE SEEN NONE SEEN   Squamous Epithelial / LPF 0-5 (A) NONE SEEN   Dg Chest 2 View  10/03/2015  CLINICAL DATA:  Unwitnessed fall. EXAM: CHEST  2 VIEW COMPARISON:  None. FINDINGS: There are numerous left rib fractures, involving at least the fourth through ninth ribs posteriorly. Most of the fractures are displaced and overriding. There is a left pneumothorax, approximately 20% by volume. There is extensive left hemi thorax subcutaneous emphysema. The right lung is well expanded and clear. Pulmonary vasculature is normal. Mediastinal contours are normal. There are remote healed fracture deformities of the clavicles and humeri  bilaterally. IMPRESSION: 1. Left pneumothorax, approximately 20% by volume. 2. Extensive left subcutaneous emphysema. 3. Numerous left rib fractures, displaced and overriding. These results were called by telephone at the time of interpretation on 10/03/2015 at 10:31 pm to Dr. Nance Pear , who verbally acknowledged these results. Electronically Signed   By: Andreas Newport M.D.   On: 10/03/2015 22:33   Ct Head Wo Contrast  10/03/2015  CLINICAL DATA:  82 year old female with fall. EXAM: CT HEAD WITHOUT CONTRAST TECHNIQUE: Contiguous axial images were obtained from the base of the skull through the vertex without intravenous contrast. COMPARISON:  CT dated 02/20/2013 FINDINGS: There is moderate age-related atrophy and chronic microvascular ischemic changes. There is no acute intracranial hemorrhage. No mass effect or midline shift noted. The visualized paranasal sinuses and mastoid air cells are clear. The calvarium is intact. IMPRESSION: No acute intracranial hemorrhage. Age-related atrophy and chronic microvascular ischemic disease. Electronically Signed   By: Anner Crete M.D.   On: 10/03/2015 22:22   Ct Chest W Contrast  10/03/2015  CLINICAL DATA:  Unwitnessed fall. EXAM: CT CHEST, ABDOMEN, AND PELVIS WITH CONTRAST TECHNIQUE: Multidetector CT imaging of the chest, abdomen and pelvis was performed following the standard protocol during bolus administration of intravenous contrast. CONTRAST:  75 mL Isovue 370 intravenous COMPARISON:  Radiographs 10/03/2015 FINDINGS: CT CHEST There are displaced fractures of the left second through eighth ribs. There is a moderate left pneumothorax, 15-20% by volume. There is left subcutaneous emphysema. There is a small left pleural effusion. There is mild consolidation or contusion of the posterior left lower lobe. The right lung is expanded and clear. The airways are patent. There is no intrathoracic vascular injury. Vertebral column and sternum are intact. There  is mild compression of T9 which appears to be chronic. CT ABDOMEN AND PELVIS There are intact appearances of the liver, spleen, pancreas, adrenals and kidneys. There is mild diffuse fatty infiltration of the liver. Bowel is remarkable only for extensive colonic diverticulosis. There is no peritoneal blood or free air. There is no intra-abdominal vascular injury. There is moderate compression of L1 which is of indeterminate age but more likely remote. Mild remote compression of L5. No evidence of acute fracture involving the pelvis or hips. Remote healed left pubic ramus fracture deformities are present. IMPRESSION: 1. Acute displaced fractures of the left second through eighth ribs. Moderate left pneumothorax,  15-20% by volume. 2. Small left pleural effusion and left lower lobe atelectasis or contusion. 3. No acute traumatic injury in the abdomen or pelvis. 4. Moderate compression of L1, indeterminate age but more likely remote. 5. Chronic appearing compressions of T9 and L5. Remote healed left pubic ramus fracture deformities. Remote posttraumatic deformities of the proximal humerus bilaterally, incompletely united on the left. Electronically Signed   By: Andreas Newport M.D.   On: 10/03/2015 23:26   Ct Abdomen Pelvis W Contrast  10/03/2015  CLINICAL DATA:  Unwitnessed fall. EXAM: CT CHEST, ABDOMEN, AND PELVIS WITH CONTRAST TECHNIQUE: Multidetector CT imaging of the chest, abdomen and pelvis was performed following the standard protocol during bolus administration of intravenous contrast. CONTRAST:  75 mL Isovue 370 intravenous COMPARISON:  Radiographs 10/03/2015 FINDINGS: CT CHEST There are displaced fractures of the left second through eighth ribs. There is a moderate left pneumothorax, 15-20% by volume. There is left subcutaneous emphysema. There is a small left pleural effusion. There is mild consolidation or contusion of the posterior left lower lobe. The right lung is expanded and clear. The airways are  patent. There is no intrathoracic vascular injury. Vertebral column and sternum are intact. There is mild compression of T9 which appears to be chronic. CT ABDOMEN AND PELVIS There are intact appearances of the liver, spleen, pancreas, adrenals and kidneys. There is mild diffuse fatty infiltration of the liver. Bowel is remarkable only for extensive colonic diverticulosis. There is no peritoneal blood or free air. There is no intra-abdominal vascular injury. There is moderate compression of L1 which is of indeterminate age but more likely remote. Mild remote compression of L5. No evidence of acute fracture involving the pelvis or hips. Remote healed left pubic ramus fracture deformities are present. IMPRESSION: 1. Acute displaced fractures of the left second through eighth ribs. Moderate left pneumothorax, 15-20% by volume. 2. Small left pleural effusion and left lower lobe atelectasis or contusion. 3. No acute traumatic injury in the abdomen or pelvis. 4. Moderate compression of L1, indeterminate age but more likely remote. 5. Chronic appearing compressions of T9 and L5. Remote healed left pubic ramus fracture deformities. Remote posttraumatic deformities of the proximal humerus bilaterally, incompletely united on the left. Electronically Signed   By: Andreas Newport M.D.   On: 10/03/2015 23:26   Dg Chest Portable 1 View  10/04/2015  CLINICAL DATA:  82 year old female with left-sided chest tube placement EXAM: PORTABLE CHEST 1 VIEW COMPARISON:  Chest radiograph and CT dated 10/03/2015 FINDINGS: There has been interval placement of a left-sided chest tube. The tip of chest tube appears at the left lateral bony thorax are outside to the ribs. Recommend re-evaluation and repositioning. There is a small left apical pneumothorax measuring approximately 2 cm to the apical pleural. Left lung base opacity compatible with combination of small pleural effusion and associated atelectasis/ contusion of the adjacent lung.  The right lung is clear. Multiple left rib fractures noted. There is subcutaneous soft tissue edema of the left chest wall. Bilateral humeral neck old fractures with nonunion on the left. IMPRESSION: Left-sided chest tube terminates outside to the lateral bony thorax. Recommend re-evaluation and repositioning. Small left apical pneumothorax. Electronically Signed   By: Anner Crete M.D.   On: 10/04/2015 00:44    Review of Systems  Constitutional: Negative for fever.  HENT: Negative.   Eyes: Negative for double vision.  Respiratory: Positive for cough. Negative for shortness of breath.   Cardiovascular: Positive for chest pain.  Gastrointestinal: Negative  for nausea, vomiting and abdominal pain.  Genitourinary: Negative.   Musculoskeletal: Negative.   Skin:       Skin mass R shoulder  Neurological: Negative for sensory change, speech change and loss of consciousness.  Endo/Heme/Allergies: Negative.   Psychiatric/Behavioral: Negative.     Blood pressure 155/78, pulse 106, temperature 98.2 F (36.8 C), temperature source Oral, resp. rate 17, SpO2 98 %. Physical Exam  Constitutional: She appears well-developed. No distress.  HENT:  Head: Normocephalic.  Right Ear: External ear normal.  Left Ear: External ear normal.  Nose: Nose normal.  Mouth/Throat: Oropharynx is clear and moist.  Eyes: EOM are normal. Pupils are equal, round, and reactive to light. Right eye exhibits no discharge. Left eye exhibits no discharge.  Neck: No tracheal deviation present.  No posterior midline tenderness, no pain on AROM  Cardiovascular: Regular rhythm, normal heart sounds and intact distal pulses.   HR 110  Respiratory: No stridor. No respiratory distress. She has no wheezes. She has no rales. She exhibits tenderness.  Crepitance left chest wall with tenderness, chest tube site with tube in about 6 cm, decreased breath sounds on left  GI: Soft. She exhibits no distension. There is no tenderness.  There is no rebound and no guarding.  Musculoskeletal:       Arms: Skin tear right elbow, 3 cm discoid skin mass right shoulder  Neurological: She is alert. She displays no atrophy and no tremor. No cranial nerve deficit. She exhibits normal muscle tone. She displays no seizure activity. GCS eye subscore is 4. GCS verbal subscore is 5. GCS motor subscore is 6.  Moves all extremities well to command  Skin:  See above  Psychiatric: She has a normal mood and affect.     Assessment/Plan Fall Left rib fractures 2 through 8 with pneumothorax - chest tube placed at outside facility is not within the thoracic cavity. A new chest tube was placed here and follow-up chest x-ray shows improvement. Skin lesion R shoulder Skin tear R elbow ETOH abuse - CIWA  I spoke with her daughter at the bedside  Zenovia Jarred, MD 10/04/2015, 2:39 AM

## 2015-10-04 NOTE — Procedures (Signed)
   Pre-operative Diagnosis: Left pneumothorax   Post-operative Diagnosis:  Same  Surgeon: [place] Iliza Blankenbeckler   Assistants: None  Anesthesia: Local anesthesia 1% buffered lidocaine  ASA Class: 2    Procedure Details  The patient was seen again in the ED. The benefits, complications, treatment options, and expected outcomes were discussed with the patient. The risks of bleeding, infection, recurrence of symptoms, failure to resolve symptoms  Left chest wall was prepped with Chloraprep and draped in the sterile fashion. The patient was positioned in the supine position.  After injection of 20cc of 1% lidocaine with epinephrine, a small skin incision was made overlying the 4th rib in the midaxillary line. This dissection was carried down to the 4th rib and using the trocar the pleural space was entered above the 4th rib in the interspace.  Immediate gush of air was noted upon entry into pleural space.  47F thoracostomy tube was then placed into pleural space posteriorly, and secured into place using 2-0 nylon suture.  This was then sterilely dressed.  Patient tolerated procedure well.  Postprocedure chest Xray confirmed chest tube in pleural space and modest resolution of pneumothorax  Estimated Blood Loss: minimal         Drains: 47F left chest tube         Specimens: none          Complications: none                  Condition: stable  Mali Meha Vidrine MD

## 2015-10-04 NOTE — Evaluation (Signed)
Physical Therapy Evaluation Patient Details Name: Lisa Chan MRN: [id number] DOB: 1926-10-02 Today's Date: 10/04/2015   History of Present Illness  82 y.o. female admitted to [facility] on 10/04/15 s/p fall wiht rib fxs and pneumopthorax.  L chest tube placed 6/30, and revised 10/04/15.  Pt with significant PMhx of arthritis.    Clinical Impression  Pt was able to get OOB to chair with significant effort and help from PT.  HR increased from low 100s to high 130s during session.  O2 sats remained stable on 4 L O2 [place].  RN made aware.  BP also elevated after transfer to chair (see vitals flow sheet).  Pt, at this time would need significant care, but may progress as she stabilizes. I am currently recommending SNF for rehab.   PT to follow acutely for deficits listed below.     Follow Up Recommendations SNF;Supervision/Assistance - 24 hour    Equipment Recommendations  Rolling walker with 5" wheels    Recommendations for Other Services   NA     Precautions / Restrictions Precautions Precautions: Fall Precaution Comments: pt reports significant PMHx of multiple falls      Mobility  Bed Mobility Overal bed mobility: Needs Assistance Bed Mobility: Supine to Sit     Supine to sit: Max assist;HOB elevated     General bed mobility comments: Max assist to help progress legs to EOB and support trunk/weight shift hips to EOB.   Transfers Overall transfer level: Needs assistance Equipment used: Rolling walker (2 wheeled);None Transfers: Sit to/from [facility] Sit to Stand: Min assist;From elevated surface Stand pivot transfers: Mod assist;From elevated surface       General transfer comment: Min assist to stand EOB with RW, pt unable to pivot with RW, though due to reports of weakness, reliance on bed for support against her legs for balance.  Sat down and stood again without RW with therapist's assist to support trunk and turn to the chair.   Ambulation/Gait              General Gait Details: unable at this time.          Balance Overall balance assessment: Needs assistance Sitting-balance support: Feet supported;Bilateral upper extremity supported Sitting balance-Leahy Scale: Poor Sitting balance - Comments: Min assist EOB  Postural control: Posterior lean Standing balance support: Bilateral upper extremity supported Standing balance-Leahy Scale: Poor Standing balance comment: min to mod assist in standing.                              Pertinent Vitals/Pain Pain Assessment: Faces Faces Pain Scale: Hurts worst (after transfer to recliner chair) Pain Location: left side Pain Descriptors / Indicators: Grimacing;Guarding Pain Intervention(s): Limited activity within patient's tolerance;Monitored during session;Repositioned;Other (comment) (per RN she has had all pain meds she can have)    [place] expects to be discharged to:: Private residence Living Arrangements: Alone Available Help at Discharge: Family;Other (Comment) (per pt (family not present to confirm) daughter could 24/7) Type of Home: House Home Access: Stairs to enter Entrance Stairs-Rails: Right Entrance Stairs-Number of Steps: "a few" Home Layout: One level Home Equipment: None      Prior Function Level of Independence: Independent (with falls)         Comments: per pt report she still drives        Extremity/Trunk Assessment   Upper Extremity Assessment: Generalized weakness  Lower Extremity Assessment: Generalized weakness      Cervical / Trunk Assessment: Kyphotic  Communication   Communication: Other (comment) (very quite whisper voice today)  Cognition Arousal/Alertness: Lethargic;Suspect due to medications Behavior During Therapy: Flat affect Overall Cognitive Status: Within Functional Limits for tasks assessed (slow to respond, but generally seems accurate)                                Assessment/Plan    PT Assessment Patient needs continued PT services  PT Diagnosis Difficulty walking;Abnormality of gait;Generalized weakness;Acute pain   PT Problem List Decreased strength;Decreased activity tolerance;Decreased balance;Decreased mobility;Decreased knowledge of use of DME;Cardiopulmonary status limiting activity;Pain  PT Treatment Interventions DME instruction;Gait training;Stair training;Therapeutic activities;Functional mobility training;Therapeutic exercise;Balance training;Neuromuscular re-education;Patient/family education   PT Goals (Current goals can be found in the Care Plan section) Acute Rehab PT Goals Patient Stated Goal: none stated PT Goal Formulation: With patient Time For Goal Achievement: 10/18/15 Potential to Achieve Goals: Good    Frequency Min 3X/week   Barriers to discharge   will need to confirm daughter's ability to provide care       End of Session Equipment Utilized During Treatment: Oxygen Activity Tolerance: Patient limited by pain Patient left: in chair;with call bell/phone within reach;with chair alarm set Nurse Communication: Mobility status         Time: [id number] PT Time Calculation (min) (ACUTE ONLY): 52 min   Charges:   PT Evaluation $PT Eval Moderate Complexity: 1 Procedure PT Treatments $Therapeutic Activity: 23-37 mins        Doyle Kunath B. [place], [place], DPT [phone]   10/04/2015, 6:19 PM

## 2015-10-04 NOTE — Procedures (Signed)
Chest Tube Insertion Procedure Note  Pre-operative Diagnosis: L pneumothorax  Post-operative Diagnosis: L pneumothorax  Procedure Details  Informed consent was obtained for the procedure, including sedation.  Risks of lung perforation, hemorrhage, arrhythmia, and adverse drug reaction were discussed.   After sterile skin prep, using standard technique, old chest tube was removed and a new 20 French tube was placed in the left anterior axillary line nipple level.  Findings: Small rush of air  Estimated Blood Loss:  less than 50 mL         Specimens:  None              Complications:  None; patient tolerated the procedure well.         Disposition: ICU - extubated and stable.         Georganna Skeans, MD, MPH, FACS Trauma: [phone] General Surgery: [phone]

## 2015-10-05 ENCOUNTER — Inpatient Hospital Stay (HOSPITAL_COMMUNITY): Payer: Medicare PPO

## 2015-10-05 ENCOUNTER — Encounter (HOSPITAL_COMMUNITY): Payer: Self-pay | Admitting: *Deleted

## 2015-10-05 ENCOUNTER — Other Ambulatory Visit: Payer: Self-pay

## 2015-10-05 MED ORDER — METOPROLOL TARTRATE 25 MG PO TABS
12.5000 mg | ORAL_TABLET | Freq: Two times a day (BID) | ORAL | Status: DC
  Administered 2015-10-05: 12.5 mg via ORAL
  Filled 2015-10-05: qty 1

## 2015-10-05 MED ORDER — METOPROLOL TARTRATE 25 MG PO TABS
25.0000 mg | ORAL_TABLET | Freq: Two times a day (BID) | ORAL | Status: DC
  Administered 2015-10-05 – 2015-10-08 (×7): 25 mg via ORAL
  Filled 2015-10-05 (×7): qty 1

## 2015-10-05 MED ORDER — METOPROLOL TARTRATE 5 MG/5ML IV SOLN
INTRAVENOUS | Status: AC
  Filled 2015-10-05: qty 5

## 2015-10-05 MED ORDER — HYDRALAZINE HCL 20 MG/ML IJ SOLN
INTRAMUSCULAR | Status: AC
  Filled 2015-10-05: qty 1

## 2015-10-05 MED ORDER — METOPROLOL TARTRATE 5 MG/5ML IV SOLN
5.0000 mg | Freq: Once | INTRAVENOUS | Status: AC
  Administered 2015-10-05: 5 mg via INTRAVENOUS

## 2015-10-05 MED ORDER — HYDRALAZINE HCL 20 MG/ML IJ SOLN
10.0000 mg | INTRAMUSCULAR | Status: DC | PRN
  Administered 2015-10-05 – 2015-10-09 (×5): 10 mg via INTRAVENOUS
  Filled 2015-10-05 (×4): qty 1

## 2015-10-05 NOTE — Progress Notes (Addendum)
Patient ID: Lisa Chan, female   DOB: May 27, 1926, 82 y.o.   MRN: [id number]    Subjective: Sore, hungry  Objective: Vital signs in last 24 hours: Temp:  [97.7 F (36.5 C)-98.9 F (37.2 C)] 98.5 F (36.9 C) (07/02 0800) Pulse Rate:  [83-131] 86 (07/02 0900) Resp:  [9-62] 23 (07/02 0900) BP: (101-189)/(59-103) 120/71 mmHg (07/02 0900) SpO2:  [96 %-100 %] 99 % (07/02 0900)    Intake/Output from previous day: 07/01 0701 - 07/02 0700 In: 1300 [P.O.:100; I.V.:1200] Out: 800 [Urine:500; Chest Tube:300] Intake/Output this shift: Total I/O In: 100 [I.V.:100] Out: 0   General appearance: cooperative Resp: clear to auscultation bilaterally Cardio: 120s with ectopy GI: soft, NT, ND Extremities: skin lesion R shoulder  Lab Results: CBC   Recent Labs  10/03/15 2149 10/04/15 0323  WBC 10.1 7.1  HGB 12.2 9.9*  HCT 35.1 29.7*  PLT 239 194   BMET    Component Value Date/Time   NA 132* 10/04/2015 0323   NA 136 02/21/2013 0521   K 3.8 10/04/2015 0323   K 3.2* 02/21/2013 0521   CL 95* 10/04/2015 0323   CL 103 02/21/2013 0521   CO2 25 10/04/2015 0323   CO2 28 02/21/2013 0521   GLUCOSE 132* 10/04/2015 0323   GLUCOSE 96 02/21/2013 0521   BUN <5* 10/04/2015 0323   BUN 6* 02/21/2013 0521   CREATININE 0.57 10/04/2015 0323   CREATININE 0.75 02/21/2013 0521   CALCIUM 8.1* 10/04/2015 0323   CALCIUM 7.7* 02/21/2013 0521   GFRNONAA >60 10/04/2015 0323   GFRNONAA >60 02/21/2013 0521   GFRAA >60 10/04/2015 0323   GFRAA >60 02/21/2013 0521     Assessment/Plan: Fall L rib FX 2-8 with PTX - CT to water seal, pulm toilet, IS<250 but working on it CV - tachy with ectopy, start lopressor FEN - soft diet, watch Na ABL anemia Skin mass R shoulder - may F/U with me as outpatient VTE - Lovenox ETOH abuse - CIWA Dispo - SDU, plan SNF I spoke with her daughter   LOS: 1 day    Georganna Skeans, MD, MPH, FACS Trauma: [phone] General Surgery: [phone]  10/05/2015

## 2015-10-06 ENCOUNTER — Inpatient Hospital Stay (HOSPITAL_COMMUNITY): Payer: Medicare PPO

## 2015-10-06 LAB — CBC
HCT: 26.3 % — ABNORMAL LOW (ref 36.0–46.0)
HEMOGLOBIN: 8.7 g/dL — AB (ref 12.0–15.0)
MCH: 34.3 pg — ABNORMAL HIGH (ref 26.0–34.0)
MCHC: 33.1 g/dL (ref 30.0–36.0)
MCV: 103.5 fL — ABNORMAL HIGH (ref 78.0–100.0)
PLATELETS: 161 10*3/uL (ref 150–400)
RBC: 2.54 MIL/uL — ABNORMAL LOW (ref 3.87–5.11)
RDW: 13.4 % (ref 11.5–15.5)
WBC: 4.2 10*3/uL (ref 4.0–10.5)

## 2015-10-06 LAB — BASIC METABOLIC PANEL
Anion gap: 6 (ref 5–15)
BUN: 6 mg/dL (ref 6–20)
CALCIUM: 8.2 mg/dL — AB (ref 8.9–10.3)
CHLORIDE: 102 mmol/L (ref 101–111)
CO2: 25 mmol/L (ref 22–32)
CREATININE: 0.5 mg/dL (ref 0.44–1.00)
GFR calc Af Amer: >60 mL/min (ref >60)
GFR calc non Af Amer: >60 mL/min (ref >60)
GLUCOSE: 92 mg/dL (ref 65–99)
Potassium: 3.9 mmol/L (ref 3.5–5.1)
Sodium: 133 mmol/L — ABNORMAL LOW (ref 135–145)

## 2015-10-06 MED FILL — Morphine Sulfate Inj 10 MG/ML: INTRAMUSCULAR | Qty: 1 | Status: AC

## 2015-10-06 NOTE — Progress Notes (Signed)
Physical Therapy Treatment Patient Details Name: Lisa Chan MRN: [id number] DOB: 11/08/1926 Today's Date: 10/06/2015    History of Present Illness 82 y.o. female admitted to [facility] on 10/04/15 s/p fall wiht rib fxs and pneumopthorax.  L chest tube placed 6/30, and revised 10/04/15.  Pt with significant PMhx of arthritis.      PT Comments    Pt is making very slow progress towards goals.  She was still unable to take steps around with RW, however, did well on RA maintaining sats at 96% during session.  She is fearful of falling and very painful during mobility.  She continues to be appropriate for SNF for rehab and I introduced this idea to her today.  PT will continue to follow acutely.   Follow Up Recommendations  SNF     Equipment Recommendations  Rolling walker with 5" wheels    Recommendations for Other Services   NA     Precautions / Restrictions Precautions Precautions: Fall Precaution Comments: pt reports significant PMHx of multiple falls, also verbalized fear of falling. Restrictions Weight Bearing Restrictions: No    Mobility  Bed Mobility Overal bed mobility: Needs Assistance Bed Mobility: Sit to Supine       Sit to supine: Max assist   General bed mobility comments: Max assist to support trunk and bring legs up into the bed.  Pt not helping much with transitions  Transfers Overall transfer level: Needs assistance Equipment used: Rolling walker (2 wheeled);None Transfers: Sit to/from [facility] Sit to Stand: Mod assist Stand pivot transfers: Mod assist       General transfer comment: Mod assist to support trunk to get to standing from lower recliner chair.  Attempted to walk/take steps with RW, but pt unable to, even assisted, weight shift enough to lift feet to step.  PT sat pt back down and moved recliner chair closer to the bed and with heavy mod assist helped her stand and pivot without the RW back to the bed.           Balance  Overall balance assessment: Needs assistance Sitting-balance support: Feet supported;Bilateral upper extremity supported Sitting balance-Leahy Scale: Fair   Postural control: Posterior lean Standing balance support: No upper extremity supported;Bilateral upper extremity supported Standing balance-Leahy Scale: Poor Standing balance comment: min assist in standing, statically                     Cognition Arousal/Alertness: Lethargic Behavior During Therapy: Flat affect Overall Cognitive Status: No family/caregiver present to determine baseline cognitive functioning (continues to be slow, lethargic, needs time to process)                      Exercises Other Exercises Other Exercises: incentive spirometer x 3 <250 mL        Pertinent Vitals/Pain Pain Assessment: Faces Faces Pain Scale: Hurts whole lot Pain Location: left ribs, low back Pain Descriptors / Indicators: Grimacing;Guarding Pain Intervention(s): Limited activity within patient's tolerance;Monitored during session;Premedicated before session    Home Living Family/patient expects to be discharged to:: Skilled nursing facility Living Arrangements: Alone Available Help at Discharge: Family;Other (Comment)   Home Access: Stairs to enter Entrance Stairs-Rails: Right Home Layout: One level Home Equipment: None Additional Comments: Above per PT information, pt too sleepy during OT eval to answer most questions. Pt was able to tell OT that she has a walk-in shower with door and both standard & handicapp toilet seats.  Prior Function Level of Independence: Independent (with falls)      Comments: per pt report she still drives   PT Goals (current goals can now be found in the care plan section) Acute Rehab PT Goals Patient Stated Goal: to go home Progress towards PT goals: Progressing toward goals    Frequency  Min 3X/week    PT Plan Current plan remains appropriate       End of Session    Activity Tolerance: No increased pain Patient left: in bed;with call bell/phone within reach;with bed alarm set     Time: 1351-1417 PT Time Calculation (min) (ACUTE ONLY): 26 min  Charges:  $Therapeutic Activity: 23-37 mins                      Willine Schwalbe B. [place], [place], DPT [phone]   10/06/2015, 2:39 PM

## 2015-10-06 NOTE — Evaluation (Signed)
Clinical/Bedside Swallow Evaluation Patient Details  Name: Lisa Chan MRN: [id number] Date of Birth: Sep 20, 1926  Today's Date: 10/06/2015 Time: SLP Start Time (ACUTE ONLY): 1330 SLP Stop Time (ACUTE ONLY): 1348 SLP Time Calculation (min) (ACUTE ONLY): 18 min  Past Medical History:  Past Medical History  Diagnosis Date  . Arthritis    Past Surgical History:  Past Surgical History  Procedure Laterality Date  . Abdominal hysterectomy     HPI:  82 y.o. female admitted to [facility] on 10/04/15 s/p fall wiht rib fxs and pneumopthorax. L chest tube placed 6/30, and revised 10/04/15. Pt with significant PMhx of arthritis.BSE ordered as RN concerned re: pooling of food in the mouth.   Assessment / Plan / Recommendation Clinical Impression  Pt seen for clinical assessment of swallow function due to RN concern of oral holding. Pt's swallow function appears to be most impacted by her AMS. Oral manipulation of boluses was delayed with intermittent oral holding present. Decreased awareness of bolus noted and pt required cueing and liquid wash to clear oral residue. Audible swallow noted with thins via straw (suspect due to decreased coordination of larger bolus). Pt reports eating a soft diet at home. Recommend: Dys 2 with thin liquids. No straws- cup sip only. Meds crushed with pudding. Will follow.     Aspiration Risk  Mild aspiration risk (given AMS)    Diet Recommendation Dysphagia 2 (Fine chop);Thin liquid   Liquid Administration via: Cup;No straw Medication Administration: Crushed with puree (pt prefers chocolate pudding) Supervision: Full supervision/cueing for compensatory strategies Compensations: Minimize environmental distractions;Slow rate;Small sips/bites;Follow solids with liquid Postural Changes: Seated upright at 90 degrees    Other  Recommendations Oral Care Recommendations: Oral care QID   Follow up Recommendations  Home health SLP;Skilled Nursing facility;24 hour  supervision/assistance    Frequency and Duration min 2x/week  2 weeks       Prognosis Prognosis for Safe Diet Advancement: Good Barriers to Reach Goals: Cognitive deficits      Swallow Study   General Date of Onset: 10/06/15 HPI: 82 y.o. female admitted to [facility] on 10/04/15 s/p fall wiht rib fxs and pneumopthorax. L chest tube placed 6/30, and revised 10/04/15. Pt with significant PMhx of arthritis.BSE ordered as RN concerned re: pooling of food in the mouth. Type of Study: Bedside Swallow Evaluation Previous Swallow Assessment: none known Diet Prior to this Study: NPO Temperature Spikes Noted: No Respiratory Status: Nasal cannula History of Recent Intubation: No Behavior/Cognition: Lethargic/Drowsy;Confused;Cooperative Oral Cavity Assessment: Within Functional Limits Oral Care Completed by SLP: Recent completion by staff Oral Cavity - Dentition: Edentulous Self-Feeding Abilities: Needs assist Patient Positioning: Upright in chair Baseline Vocal Quality: Hoarse Volitional Cough: Cognitively unable to elicit Volitional Swallow: Unable to elicit    Oral/Motor/Sensory Function Overall Oral Motor/Sensory Function: Within functional limits   Ice Chips Ice chips: Within functional limits Presentation: Spoon   Thin Liquid Thin Liquid: Impaired Presentation: Straw Oral Phase Functional Implications: Oral holding Pharyngeal  Phase Impairments:  (audible swallow with large bolus)    Nectar Thick Nectar Thick Liquid: Not tested   Honey Thick     Puree Puree: Impaired Presentation: Spoon Oral Phase Functional Implications: Oral holding   Solid   GO   Solid: Impaired Presentation: Self Fed Oral Phase Impairments: Poor awareness of bolus;Impaired mastication Oral Phase Functional Implications: Oral residue        Vinetta Bergamo MA, CCC-SLP 10/06/2015,2:06 PM

## 2015-10-06 NOTE — Progress Notes (Signed)
Patient pocketing spoon of applesauce; had to be cued continually to swallow the applesauce.

## 2015-10-06 NOTE — Care Management Important Message (Signed)
Important Message  Patient Details  Name: Lisa Chan MRN: [id number] Date of Birth: 1927-03-12   Medicare Important Message Given:  Yes    Nathen May 10/06/2015, 10:57 AM

## 2015-10-06 NOTE — Progress Notes (Signed)
Despite multiple efforts of coaching and instructing patient on IS use, she is unable to move the indicator other than a couple of millimeters.

## 2015-10-06 NOTE — Evaluation (Signed)
Occupational Therapy Evaluation Patient Details Name: Lisa Chan MRN: [id number] DOB: April 07, 1926 Today's Date: 10/06/2015    History of Present Illness 82 y.o. female admitted to [facility] on 10/04/15 s/p fall wiht rib fxs and pneumopthorax.  L chest tube placed 6/30, and revised 10/04/15.  Pt with significant PMhx of arthritis.     Clinical Impression   Patient presenting with decreased ADL and functional mobility independence secondary to above. Patient mod I PTA. Patient currently functioning at an overall min to max assist level, +2 needed at times due to lines and for safety. Patient will benefit from acute OT to increase overall independence in the areas of ADLs, functional mobility, and overall safety in order to safely discharge to venue listed below.   Overall limited eval due to lethargy during session.  Vitals WNL during OT eval. Pt on 2L/min supplemental 02 majority of session, but on RA during grooming tasks and sats remained greater than 90%.     Follow Up Recommendations  SNF;Supervision/Assistance - 24 hour    Equipment Recommendations  Other (comment) (TBD next venue of care)    Recommendations for Other Services  None at this time   Precautions / Restrictions Precautions Precautions: Fall Precaution Comments: pt reports significant PMHx of multiple falls Restrictions Weight Bearing Restrictions: No      Mobility Bed Mobility General bed mobility comments: Pt found seated in recliner upon OT entering/exiting room   Transfers Overall transfer level: Needs assistance Equipment used: None Transfers: Sit to/from Stand Sit to Stand: Min assist         General transfer comment: Min assist and mod cueing to come into standing. Pt with forward head posture and difficult standing completely erect.     Balance Overall balance assessment: Needs assistance Sitting-balance support: No upper extremity supported;Feet supported Sitting balance-Leahy Scale: Poor      Standing balance support: No upper extremity supported Standing balance-Leahy Scale: Poor Standing balance comment: min assist in standing, statically     ADL Overall ADL's : Needs assistance/impaired Eating/Feeding: NPO Eating/Feeding Details (indicate cue type and reason): SLP entering room and OT leaving Grooming: Minimal assistance;Sitting Grooming Details (indicate cue type and reason): Pt required assistance with combing of hair  Upper Body Bathing: Moderate assistance;Sitting   Lower Body Bathing: Maximal assistance;Sit to/from stand   Upper Body Dressing : Sitting;Moderate assistance   Lower Body Dressing: Maximal assistance;Sit to/from stand   Toilet Transfer: Maximal Patent examiner Details (indicate cue type and reason): simulated  General ADL Comments: Pt limited by lethargy today and barely able to keep eyes open, but would respond and willing to work with therapist. Pt unable to reach BLEs for LB ADLs. SLP reported difficult with self-feeding, bringing hand to mouth.      Vision Additional Comments: To be further assessed           Pertinent Vitals/Pain Pain Assessment: Faces Faces Pain Scale: Hurts whole lot Pain Location: "back" Pain Descriptors / Indicators: Grimacing;Guarding Pain Intervention(s): Limited activity within patient's tolerance;Monitored during session;Repositioned     Hand Dominance Right   Extremity/Trunk Assessment Upper Extremity Assessment Upper Extremity Assessment: Generalized weakness   Lower Extremity Assessment Lower Extremity Assessment: Generalized weakness   Cervical / Trunk Assessment Cervical / Trunk Assessment: Kyphotic   Communication Communication Communication: Other (comment)   Cognition Arousal/Alertness: Lethargic (Last pain medication was around 7:45) Behavior During Therapy: Flat affect Overall Cognitive Status: Within Functional Limits for tasks assessed (slow to respond, but seems  accurate)  Home Living Family/patient expects to be discharged to:: Skilled nursing facility Living Arrangements: Alone Available Help at Discharge: Family;Other (Comment)   Home Access: Stairs to enter Entrance Stairs-Number of Steps: "a few" Entrance Stairs-Rails: Right Home Layout: One level     Bathroom Shower/Tub: Walk-in shower;Door   Bathroom Toilet: Standard ("I have both")     Home Equipment: None   Additional Comments: Above per PT information, pt too sleepy during OT eval to answer most questions. Pt was able to tell OT that she has a walk-in shower with door and both standard & handicapp toilet seats.       Prior Functioning/Environment Level of Independence: Independent (with falls)        Comments: per pt report she still drives    OT Diagnosis: Generalized weakness;Acute pain   OT Problem List: Decreased strength;Decreased activity tolerance;Impaired balance (sitting and/or standing);Decreased safety awareness;Decreased knowledge of use of DME or AE;Decreased knowledge of precautions;Pain;Cardiopulmonary status limiting activity   OT Treatment/Interventions: Self-care/ADL training;Therapeutic exercise;Energy conservation;DME and/or AE instruction;Therapeutic activities;Patient/family education;Balance training    OT Goals(Current goals can be found in the care plan section) Acute Rehab OT Goals Patient Stated Goal: none stated OT Goal Formulation: With patient Time For Goal Achievement: 10/20/15 Potential to Achieve Goals: Good ADL Goals Pt Will Perform Grooming: with min guard assist;standing Pt Will Perform Lower Body Bathing: with min assist;sit to/from stand Pt Will Perform Lower Body Dressing: with min assist;sit to/from stand Pt Will Transfer to Toilet: with min assist;ambulating;bedside commode  OT Frequency: Min 2X/week   Barriers to D/C: Unknown    End of Session Equipment Utilized During Treatment: Oxygen Nurse  Communication: Mobility status;Other (comment) (encouraged BSC for toileting)  Activity Tolerance: Patient tolerated treatment well Patient left: in chair;with call bell/phone within reach;Other (comment) (with SLP entering room)   Time: [id number] OT Time Calculation (min): 18 min Charges:  OT General Charges $OT Visit: 1 Procedure OT Evaluation $OT Eval Moderate Complexity: 1 Procedure  Chrys Racer , MS, OTR/L, CLT Pager: [phone]  10/06/2015, 1:41 PM

## 2015-10-06 NOTE — Progress Notes (Signed)
Trauma Service Note  Subjective: Patient doing okay.  Asleep when I came by the bedside.  Awakened and acted appropriately.  Objective: Vital signs in last 24 hours: Temp:  [97.5 F (36.4 C)-99.5 F (37.5 C)] 97.5 F (36.4 C) (07/03 0739) Pulse Rate:  [44-162] 94 (07/03 0320) Resp:  [14-30] 14 (07/03 0320) BP: (102-165)/(61-105) 164/92 mmHg (07/03 0739) SpO2:  [95 %-100 %] 98 % (07/03 0320)    Intake/Output from previous day: 07/02 0701 - 07/03 0700 In: 1210 [P.O.:60; I.V.:1150] Out: 260 [Urine:100; Chest Tube:160] Intake/Output this shift: Total I/O In: 60 [P.O.:60] Out: -   General: In general distress when asked to move.  Only getting tramadol for pain control  Lungs: Diminished in the bases.  Small left apical PTX.  CT output 160 cc last 24 hours.  No air leak noted on examination  Abd: soft, benign, but the nurse was concerned about the patient pooling food in her mouth and not swallowing appropriately.  Extremities: No changes  Neuro: Intact it seems  Lab Results: CBC   Recent Labs  10/04/15 0323 10/06/15 0441  WBC 7.1 4.2  HGB 9.9* 8.7*  HCT 29.7* 26.3*  PLT 194 161   BMET  Recent Labs  10/04/15 0323 10/06/15 0441  NA 132* 133*  K 3.8 3.9  CL 95* 102  CO2 25 25  GLUCOSE 132* 92  BUN <5* 6  CREATININE 0.57 0.50  CALCIUM 8.1* 8.2*   PT/INR No results for input(s): LABPROT, INR in the last 72 hours. ABG No results for input(s): PHART, HCO3 in the last 72 hours.  Invalid input(s): PCO2, PO2  Studies/Results: Dg Chest Port 1 View  10/06/2015  CLINICAL DATA:  Pneumothorax. EXAM: PORTABLE CHEST 1 VIEW COMPARISON:  10/05/2015.  10/04/2015 . FINDINGS: Left chest tube in stable position. Mediastinum and hilar structures are normal. Heart size stable. Left base subsegmental atelectasis. Mild left pleural thickening. Tiny left apical pneumothorax again cannot be excluded on today's exam. Multiple displaced left rib fractures again noted. Right  clavicular fracture, possibly old. Thoracic spine scoliosis. Left chest wall subcutaneous emphysema. IMPRESSION: 1. Left chest tube in stable position. Tiny left apical pneumothorax cannot be excluded on today's exam. Left chest wall subcutaneous emphysema stable. Multiple displaced left rib fractures again noted. Right clavicular fracture, possibly old. 2.  Mild left base atelectasis. Critical Value/emergent results were called by telephone at the time of interpretation on 10/06/2015 at 7:36 am to nurse Judeen Hammans, who verbally acknowledged these results. Electronically Signed   By: Marcello Moores  Register   On: 10/06/2015 07:40   Dg Chest Port 1 View  10/05/2015  CLINICAL DATA:  Traumatic hemopneumothorax EXAM: PORTABLE CHEST 1 VIEW COMPARISON:  10/04/2015 FINDINGS: Cardiac shadow is stable. Aortic calcifications are again noted. A left chest tube is seen. Multiple left rib fractures are again noted. Subcutaneous emphysema is seen. No definitive pneumothorax is noted. The right lung is clear. Small left pleural effusion is noted. IMPRESSION: No pneumothorax is noted.  The remainder of the exam is stable. Electronically Signed   By: Inez Catalina M.D.   On: 10/05/2015 08:34    Anti-infectives: Anti-infectives    None      Assessment/Plan: s/p  Swallowing evaluation.  NPO except sips with meds.    LOS: 2 days   [name]. Dahlia Bailiff, MD, FACS [phone] Trauma Surgeon 10/06/2015

## 2015-10-07 ENCOUNTER — Inpatient Hospital Stay (HOSPITAL_COMMUNITY): Payer: Medicare PPO

## 2015-10-07 LAB — CBC WITH DIFFERENTIAL/PLATELET
BASOS PCT: 1 %
Basophils Absolute: 0 10*3/uL (ref 0.0–0.1)
EOS PCT: 3 %
Eosinophils Absolute: 0.2 10*3/uL (ref 0.0–0.7)
HEMATOCRIT: 32.5 % — AB (ref 36.0–46.0)
Hemoglobin: 10.5 g/dL — ABNORMAL LOW (ref 12.0–15.0)
LYMPHS PCT: 27 %
Lymphs Abs: 1.5 10*3/uL (ref 0.7–4.0)
MCH: 34 pg (ref 26.0–34.0)
MCHC: 32.3 g/dL (ref 30.0–36.0)
MCV: 105.2 fL — AB (ref 78.0–100.0)
MONO ABS: 0.6 10*3/uL (ref 0.1–1.0)
Monocytes Relative: 11 %
NEUTROS PCT: 58 %
Neutro Abs: 3.4 10*3/uL (ref 1.7–7.7)
PLATELETS: 255 10*3/uL (ref 150–400)
RBC: 3.09 MIL/uL — ABNORMAL LOW (ref 3.87–5.11)
RDW: 13.4 % (ref 11.5–15.5)
WBC: 5.8 10*3/uL (ref 4.0–10.5)

## 2015-10-07 MED ORDER — SODIUM CHLORIDE 0.9 % IV BOLUS (SEPSIS)
500.0000 mL | Freq: Once | INTRAVENOUS | Status: AC
  Administered 2015-10-07: 500 mL via INTRAVENOUS

## 2015-10-07 MED ORDER — LORAZEPAM 2 MG/ML IJ SOLN
2.0000 mg | INTRAMUSCULAR | Status: DC | PRN
  Administered 2015-10-07 – 2015-10-08 (×5): 2 mg via INTRAVENOUS
  Filled 2015-10-07 (×5): qty 1

## 2015-10-07 MED ORDER — METOPROLOL TARTRATE 5 MG/5ML IV SOLN
5.0000 mg | Freq: Four times a day (QID) | INTRAVENOUS | Status: DC | PRN
  Administered 2015-10-07 – 2015-10-10 (×5): 5 mg via INTRAVENOUS
  Filled 2015-10-07 (×5): qty 5

## 2015-10-07 NOTE — Progress Notes (Signed)
SLP Cancellation Note  Patient Details Name: AALLIYAH RICKETTS MRN: [id number] DOB: 1927/03/19   Cancelled treatment:       Reason Eval/Treat Not Completed: Patient declined, was resistant to attempts at repositioning due to fear of pain. Could not be positioned in a safer position for PO intake. Will continue efforts.   Germain Osgood, M.A. CCC-SLP [phone]  Germain Osgood 10/07/2015, 3:09 PM

## 2015-10-07 NOTE — Progress Notes (Signed)
Patient with decreased urinary output. Trauma called. New ordered received.  Patient confused, attempting to climb out of bed, restless, unable to reorient. CIWA orders expired. Dr. Kieth Brightly- trauma called. New orders received. Will cont to monitor

## 2015-10-07 NOTE — Progress Notes (Signed)
Pt OOB to bedside commode then to chair. Pt tolerated well. VSS.

## 2015-10-07 NOTE — Progress Notes (Signed)
Physical Therapy Treatment Patient Details Name: SHELEE BENEDICTO MRN: [id number] DOB: February 10, 1927 Today's Date: 10/07/2015    History of Present Illness 82 y.o. female admitted to [facility] on 10/04/15 s/p fall wiht rib fxs and pneumopthorax.  L chest tube placed 6/30, and revised 10/04/15.  Pt with significant PMhx of arthritis.      PT Comments    Pt kept stating during sessions that she was too weak to do any type of movement. She was reluctant to move and wanted PT to come later but with encouragement decided to move to her chair. Pt was encouraged to keep moving in order to help healing.  Pt has progressed towards goals with would continue to benefit from PT to increase her functional independence.   Follow Up Recommendations  SNF     Equipment Recommendations  Rolling walker with 5" wheels    Recommendations for Other Services       Precautions / Restrictions Precautions Precautions: Fall Restrictions Weight Bearing Restrictions: No    Mobility  Bed Mobility Overal bed mobility: + 2 for safety/equipment;Needs Assistance Bed Mobility: Supine to Sit     Supine to sit: Mod assist;HOB elevated     General bed mobility comments: Mod assist to support trunk. Pt was able to move legs off the bed. Pt was able to move to herself with min guard.   Transfers Overall transfer level: Needs assistance Equipment used: Rolling walker (2 wheeled) Transfers: Sit to/from Stand Stand Pivot: Min assist; +2 safety/equipment Sit to Stand: Min assist;+2 safety/equipment         General transfer comment: Min assist to help support truck to get to standing. Pt was able to push off the bed and grab onto the RW. Pt able to step pivot to chair slowly with RW. Min assist to help sit into chair.          Balance Overall balance assessment: Needs assistance Sitting-balance support: Feet supported;Single extremity supported Sitting balance-Leahy Scale: Poor     Standing balance support:  Bilateral upper extremity supported Standing balance-Leahy Scale: Poor Standing balance comment: min assist to help stand and keep balance while stepping pivot.                    Cognition Arousal/Alertness: Awake/alert Behavior During Therapy: Flat affect Overall Cognitive Status: Within Functional Limits for tasks assessed                      Exercises Other Exercises Other Exercises: incentive spirometer x 7 <400 mL        Pertinent Vitals/Pain Pain Assessment: Faces Faces Pain Scale: Hurts even more Pain Location: no location stated Pain Descriptors / Indicators: Grimacing;Guarding Pain Intervention(s): Limited activity within patient's tolerance;Monitored during session           PT Goals (current goals can now be found in the care plan section) Acute Rehab PT Goals Patient Stated Goal: none stated Progress towards PT goals: Progressing toward goals    Frequency  Min 3X/week    PT Plan Current plan remains appropriate       End of Session   Activity Tolerance: Patient limited by fatigue;No increased pain Patient left: in chair;with call bell/phone within reach;with chair alarm set     Time: [phone] PT Time Calculation (min) (ACUTE ONLY): 40 min  Charges:                       G Codes:  Nash Mantis, [place] #[phone]  10/07/2015, 2:07 PM

## 2015-10-07 NOTE — Progress Notes (Signed)
Patient with sustained HR 130-140s. EKG completed and showing Sinus Tach. Trauma called. New PRN orders given. Will cont to monitor.

## 2015-10-07 NOTE — Progress Notes (Signed)
  Subjective: Complains of pain when she moves around. She thinks someone broke her back  Objective: Vital signs in last 24 hours: Temp:  [97.4 F (36.3 C)-98.1 F (36.7 C)] 97.4 F (36.3 C) (07/04 0729) Pulse Rate:  [92-121] 121 (07/04 0729) Resp:  [12-27] 22 (07/04 0729) BP: (116-178)/(68-96) 116/68 mmHg (07/04 0729) SpO2:  [94 %-100 %] 94 % (07/04 0729) Last BM Date:  (pu unsure)  Intake/Output from previous day: 07/03 0701 - 07/04 0700 In: 1450 [P.O.:200; I.V.:1250] Out: 295 [Urine:175; Chest Tube:120] Intake/Output this shift: Total I/O In: 120 [P.O.:120] Out: 40 [Chest Tube:40]  Resp: clear to auscultation bilaterally Cardio: irregularly irregular rhythm and intermittent tachcardia GI: soft, nontender  Lab Results:   Recent Labs  10/06/15 0441 10/07/15 0436  WBC 4.2 5.8  HGB 8.7* 10.5*  HCT 26.3* 32.5*  PLT 161 255   BMET  Recent Labs  10/06/15 0441  NA 133*  K 3.9  CL 102  CO2 25  GLUCOSE 92  BUN 6  CREATININE 0.50  CALCIUM 8.2*   PT/INR No results for input(s): LABPROT, INR in the last 72 hours. ABG No results for input(s): PHART, HCO3 in the last 72 hours.  Invalid input(s): PCO2, PO2  Studies/Results: Dg Chest Port 1 View  10/06/2015  CLINICAL DATA:  Pneumothorax. EXAM: PORTABLE CHEST 1 VIEW COMPARISON:  10/05/2015.  10/04/2015 . FINDINGS: Left chest tube in stable position. Mediastinum and hilar structures are normal. Heart size stable. Left base subsegmental atelectasis. Mild left pleural thickening. Tiny left apical pneumothorax again cannot be excluded on today's exam. Multiple displaced left rib fractures again noted. Right clavicular fracture, possibly old. Thoracic spine scoliosis. Left chest wall subcutaneous emphysema. IMPRESSION: 1. Left chest tube in stable position. Tiny left apical pneumothorax cannot be excluded on today's exam. Left chest wall subcutaneous emphysema stable. Multiple displaced left rib fractures again noted. Right  clavicular fracture, possibly old. 2.  Mild left base atelectasis. Critical Value/emergent results were called by telephone at the time of interpretation on 10/06/2015 at 7:36 am to nurse Judeen Hammans, who verbally acknowledged these results. Electronically Signed   By: Marcello Moores  Register   On: 10/06/2015 07:40    Anti-infectives: Anti-infectives    None      Assessment/Plan: s/p * No surgery found * Advance diet  Continue chest tube for pneumothorax Old fractures of thoracic, lumbar spine and pelvis  LOS: 3 days    TOTH III,Christmas Faraci S 10/07/2015

## 2015-10-08 ENCOUNTER — Inpatient Hospital Stay (HOSPITAL_COMMUNITY): Payer: Medicare PPO

## 2015-10-08 DIAGNOSIS — S272XXA Traumatic hemopneumothorax, initial encounter: Secondary | ICD-10-CM | POA: Diagnosis present

## 2015-10-08 DIAGNOSIS — R2231 Localized swelling, mass and lump, right upper limb: Secondary | ICD-10-CM | POA: Diagnosis present

## 2015-10-08 DIAGNOSIS — D62 Acute posthemorrhagic anemia: Secondary | ICD-10-CM | POA: Diagnosis not present

## 2015-10-08 DIAGNOSIS — F1010 Alcohol abuse, uncomplicated: Secondary | ICD-10-CM | POA: Diagnosis present

## 2015-10-08 DIAGNOSIS — W19XXXA Unspecified fall, initial encounter: Secondary | ICD-10-CM | POA: Diagnosis present

## 2015-10-08 LAB — BASIC METABOLIC PANEL
Anion gap: 10 (ref 5–15)
BUN: 6 mg/dL (ref 6–20)
CALCIUM: 8.6 mg/dL — AB (ref 8.9–10.3)
CHLORIDE: 106 mmol/L (ref 101–111)
CO2: 20 mmol/L — AB (ref 22–32)
CREATININE: 0.62 mg/dL (ref 0.44–1.00)
GFR calc Af Amer: >60 mL/min (ref >60)
GFR calc non Af Amer: >60 mL/min (ref >60)
GLUCOSE: 101 mg/dL — AB (ref 65–99)
Potassium: 4 mmol/L (ref 3.5–5.1)
Sodium: 136 mmol/L (ref 135–145)

## 2015-10-08 LAB — URINALYSIS, ROUTINE W REFLEX MICROSCOPIC
BILIRUBIN URINE: NEGATIVE
GLUCOSE, UA: NEGATIVE mg/dL
KETONES UR: 40 mg/dL — AB
NITRITE: NEGATIVE
PH: 5.5 (ref 5.0–8.0)
PROTEIN: NEGATIVE mg/dL
Specific Gravity, Urine: 1.016 (ref 1.005–1.030)

## 2015-10-08 LAB — CBC
HEMATOCRIT: 30 % — AB (ref 36.0–46.0)
HEMOGLOBIN: 10 g/dL — AB (ref 12.0–15.0)
MCH: 35.2 pg — AB (ref 26.0–34.0)
MCHC: 33.3 g/dL (ref 30.0–36.0)
MCV: 105.6 fL — AB (ref 78.0–100.0)
Platelets: 252 10*3/uL (ref 150–400)
RBC: 2.84 MIL/uL — AB (ref 3.87–5.11)
RDW: 13.6 % (ref 11.5–15.5)
WBC: 6.1 10*3/uL (ref 4.0–10.5)

## 2015-10-08 LAB — URINE MICROSCOPIC-ADD ON

## 2015-10-08 LAB — AMMONIA: Ammonia: 25 umol/L (ref 9–35)

## 2015-10-08 MED ORDER — POLYETHYLENE GLYCOL 3350 17 G PO PACK
17.0000 g | PACK | Freq: Every day | ORAL | Status: DC
  Administered 2015-10-08 – 2015-10-12 (×5): 17 g via ORAL
  Filled 2015-10-08 (×5): qty 1

## 2015-10-08 MED ORDER — DOCUSATE SODIUM 100 MG PO CAPS
100.0000 mg | ORAL_CAPSULE | Freq: Two times a day (BID) | ORAL | Status: DC
  Administered 2015-10-08 – 2015-10-09 (×3): 100 mg via ORAL
  Filled 2015-10-08 (×4): qty 1

## 2015-10-08 NOTE — Progress Notes (Signed)
Chest tube removed. Patient tolerated procedure well.

## 2015-10-08 NOTE — Clinical Social Work Note (Signed)
Clinical Social Work Assessment  Patient Details  Name: Lisa Chan MRN: [id number] Date of Birth: 1926-09-26  Date of referral:  10/08/15               Reason for consult:  Trauma, Facility Placement                Permission sought to share information with:  Family Supports Permission granted to share information::  Yes, Verbal Permission Granted  Name::     Lisa Chan  Relationship::  Daughter  Contact Information:  [phone]  Housing/Transportation Living arrangements for the past 2 months:  [place] of Information:  Patient, Adult Children Patient Interpreter Needed:  None Criminal Activity/Legal Involvement Pertinent to Current Situation/Hospitalization:  No - Comment as needed Significant Relationships:  Adult Children, Other Family Members Lives with:  Self Do you feel safe going back to the place where you live?  Yes Need for family participation in patient care:  Yes (Comment)  Care giving concerns:  Patient daughter verbalizes concern that patient will not cope well emotionally at SNF without return home.  Patient daughter states that patient has made it very clear to her in the past that she is to always return home.   Social Worker assessment / plan:  Holiday representative met with patient and patient daughter at bedside to offer support and discuss patient needs at discharge.  Patient was able to identify her daughter and recollect what happened (fall).  Patient answered almost all questions appropriately, however hard to understand.  Patient did provide verbal permission to discuss further with daughter.  Patient daughter states that patient lives at home alone and she lives across the street.  Patient daughter also states that patient thrives well in her home and this most recent fall was due to the alcohol use.  Patient daughter feels that patient will emotionally do better and physically improve if she returns home vs. SNF at discharge.  Patient  daughter is agreeable to SNF search in [place], but would prefer home with home health.  Patient daughter is prepared to provide 24 hour support at discharge as needed too.  CSW to initiate SNF search - will await patient clarity to complete SBIRT and determine venue at discharge.  CSW remains available for support and to facilitate patient discharge needs once medically stable.  Employment status:  Retired Nurse, adult PT Recommendations:  [place], [place] / Referral to community resources:  [place]  Patient/Family's Response to care:  Patient daughter verbalizes understanding of CSW role and appreciation for support and concern.  Patient daughter is agreeable to SNF and home, as well as being able to provide adequate care.  Patient daughter feels that patient will refuse once in her right mind and home will be the only option for her at discharge.  Patient/Family's Understanding of and Emotional Response to Diagnosis, Current Treatment, and Prognosis:  Patient daughter with a very good understanding of patient history and current medical issues including her drinking habits.  Patient daughter is aware that patient may need SNF placement, however is very committed to providing care in the home if deemed necessary.  Emotional Assessment Appearance:  Appears stated age Attitude/Demeanor/Rapport:  Inconsistent Affect (typically observed):  Agitated, Blunt Orientation:  Oriented to Self, Oriented to Place Alcohol / Substance use:  Alcohol Use Psych involvement (Current and /or in the community):  No (Comment)  Discharge Needs  Concerns to  be addressed:  Discharge Planning Concerns Readmission within the last 30 days:  No Current discharge risk:  Substance Abuse, Physical Impairment Barriers to Discharge:  Continued Medical Work up  [employer], [place]

## 2015-10-08 NOTE — Progress Notes (Signed)
Initial Nutrition Assessment  DOCUMENTATION CODES:   Non-severe (moderate) malnutrition in context of chronic illness  INTERVENTION:    Magic cup BID with meals, each supplement provides 290 kcal and 9 grams of protein  Mighty Shake II BID with meals, each supplement provides 480-500 kcals and 20-23 grams of protein  Continue giving medications with chocolate pudding.  NUTRITION DIAGNOSIS:   Malnutrition related to chronic illness as evidenced by mild depletion of body fat, mild depletion of muscle mass.  GOAL:   Patient will meet greater than or equal to 90% of their needs  MONITOR:   PO intake, Supplement acceptance, Labs, Weight trends, I & O's  REASON FOR ASSESSMENT:   Consult Poor PO  ASSESSMENT:   82 year old female who tripped over a rug at home and fell striking her left chest wall. She was evaluated at [facility] emergency department and found to have left rib fractures 2-8 with associated pneumothorax; left chest tube placed at [facility]. She was transferred to [facility].    SLP working with patient during RD visit. Patient seemed confused; her hands were in mittens. Patient unable to provide any nutrition hx. Per discussion with SLP and RN, patient has only been taking bites and sips. She will not drink the Ensure supplements. She is on a dysphagia 2 diet with thin liquids (by cup). RN is giving medications in chocolate pudding. Will try Mighty Shake II and Magic Cup supplements on meal trays. Labs reviewed. Medications reviewed and include Colace, Folic acid; MVI; Miralax  Diet Order:  DIET DYS 2 Room service appropriate?: Yes; Fluid consistency:: Thin  Skin:  Reviewed, no issues  Last BM:  unknown  Height:   Ht Readings from Last 1 Encounters:  10/04/15 5\' 1"  (1.549 m)    Weight:   Wt Readings from Last 1 Encounters:  10/04/15 99 lb 3.3 oz (45 kg)    Ideal Body Weight:  47.7 kg  BMI:  Body mass index is 18.75 kg/(m^2).  Estimated  Nutritional Needs:   Kcal:  1350-1550  Protein:  65-80 gm  Fluid:  1.5 L  EDUCATION NEEDS:   No education needs identified at this time  Molli Barrows, [place], [place], [place] Pager [phone] After Hours Pager [phone]

## 2015-10-08 NOTE — Progress Notes (Signed)
Speech Language Pathology Treatment: Dysphagia  Patient Details Name: Lisa Chan MRN: [id number] DOB: 01-03-1927 Today's Date: 10/08/2015 Time: 1544-1600 SLP Time Calculation (min) (ACUTE ONLY): 16 min  Assessment / Plan / Recommendation Clinical Impression  Pt consumed several bites of puree and sips of thin liquid with coughing observed only after straw sips of thin liquid. Coughing was weak and prolonged, likely ineffective at adequately clearing suspected aspirates. Upon completion of session during later discussion with daughter, she stressed the importance of being able to use a straw to facilitate self-feeding. Recommend to continue with Dys 2 diet and thin liquids by cup for now, particularly given poor intake; however, discussed with daughter that we could potentially try thickened liquids to allow for use of straw if mentation continues to impact swallowing function.   HPI HPI: 82 y.o. female admitted to [facility] on 10/04/15 s/p fall wiht rib fxs and pneumopthorax. L chest tube placed 6/30, and revised 10/04/15. Pt with significant PMhx of arthritis.BSE ordered as RN concerned re: pooling of food in the mouth.      SLP Plan  Continue with current plan of care     Recommendations  Diet recommendations: Dysphagia 2 (fine chop);Thin liquid Liquids provided via: Cup;No straw Medication Administration: Crushed with puree Supervision: Staff to assist with self feeding;Full supervision/cueing for compensatory strategies Compensations: Minimize environmental distractions;Slow rate;Small sips/bites;Follow solids with liquid Postural Changes and/or Swallow Maneuvers: Seated upright 90 degrees;Upright 30-60 min after meal             Oral Care Recommendations: Oral care BID Follow up Recommendations: Skilled Nursing facility Plan: Continue with current plan of care     GO               Germain Osgood, M.A. CCC-SLP [phone]  Germain Osgood 10/08/2015, 4:57 PM

## 2015-10-08 NOTE — Progress Notes (Signed)
Physical Therapy Treatment Patient Details Name: Lisa Chan MRN: [id number] DOB: 1926-04-12 Today's Date: 10/08/2015    History of Present Illness 82 y.o. female admitted to [facility] on 10/04/15 s/p fall wiht rib fxs and pneumopthorax.  L chest tube placed 6/30, and revised 10/04/15.  Pt with significant PMhx of arthritis.      PT Comments    Pt was seen in bed, alone in room. Her cognition has regressed today and was not able to give the correct location she was in, date, or why she was in the hospital. Pt regressed to a max to a total assist during mobility and transfers. Pt did not progress towards goals today, but would benefit from further PT to help increase her functional ability.   Follow Up Recommendations  SNF     Equipment Recommendations  Rolling walker with 5" wheels    Recommendations for Other Services       Precautions / Restrictions Precautions Precautions: Fall Restrictions Weight Bearing Restrictions: No    Mobility  Bed Mobility Overal bed mobility: +2 for physical assistance;Needs Assistance Bed Mobility: Sidelying to Sit   Sidelying to sit: Total assist;+2 for physical assistance       General bed mobility comments: Total assist to get pt to EOB. Pt had hand on railing but did not help to move. Total assist moving bed pad, LE and trunk. HOB elevated  Transfers Overall transfer level: Needs assistance Equipment used: Rolling walker (2 wheeled);None Transfers: Risk manager;Sit to/from Stand Sit to Stand: Max assist;+2 physical assistance Stand pivot transfers: +2 physical assistance;Total assist       General transfer comment: Max assist needed to help stand in the walker, but could not straighten her trunk and fatigued and needed to sit back in bed. RW positioned out of the way and total assist needed  to help pivot pt into chair. Bed pad used to help support hips during transfer   Ambulation/Gait                 Stairs            Wheelchair Mobility    Modified Rankin (Stroke Patients Only)       Balance Overall balance assessment: Needs assistance Sitting-balance support: Feet supported;Single extremity supported Sitting balance-Leahy Scale: Poor Sitting balance - Comments: Pt started losing her balance and started to lean to the left, backwards, and forwards at time. Pt could sometimes sit with one hand support and keep balance, but not for long Postural control: Posterior lean;Left lateral lean (anterior lean)   Standing balance-Leahy Scale: Zero Standing balance comment: pt could not stand today                    Cognition Arousal/Alertness: Lethargic Behavior During Therapy: Flat affect Overall Cognitive Status: Impaired/Different from baseline Area of Impairment: Orientation;Attention;Following commands;Safety/judgement;Awareness;Problem solving Orientation Level: Disoriented to;Person;Place;Time;Situation Current Attention Level: Selective Memory: Decreased short-term memory Following Commands: Follows one step commands inconsistently Safety/Judgement: Decreased awareness of safety Awareness: Intellectual Problem Solving: Requires verbal cues;Requires tactile cues;Slow processing General Comments: Pt has decreased congnition since yesterday. Could not give name at first but later abel to. Able to give date of birth but not current month. Pt stated she was in the school gymnasium. She was not able to teel PT why she was in teh hospital, but immediately told family member what happened to her (fell and broke ribs). Kept asking for family members who were not in the hospital.  Exercises      General Comments General comments (skin integrity, edema, etc.): Pt's HR started rising when sitting EOB. ranged from 120s-166. Nurse asked if pt needed medication to reduce HR. Nurse gave pt medication to lower HR      Pertinent Vitals/Pain Pain Assessment: Faces Faces Pain Scale: Hurts little  more Pain Location: does not specify Pain Descriptors / Indicators: Grimacing Pain Intervention(s): Limited activity within patient's tolerance;Monitored during session    Home Living                      Prior Function            PT Goals (current goals can now be found in the care plan section) Acute Rehab PT Goals Patient Stated Goal: to go home Progress towards PT goals: Not progressing toward goals - comment (Pt's cognition kept patient from progressing )    Frequency  Min 3X/week    PT Plan Current plan remains appropriate    Co-evaluation             End of Session   Activity Tolerance: Patient limited by fatigue;No increased pain Patient left: in chair;with call bell/phone within reach;with chair alarm set;with family/visitor present     Time: [id number] PT Time Calculation (min) (ACUTE ONLY): 43 min  Charges:  $Therapeutic Activity: 38-52 mins                    G CodesBenard Rink [id number]  10/08/2015, 5:28 PM

## 2015-10-08 NOTE — Progress Notes (Signed)
Patient ID: Lisa Chan, female   DOB: [birth year], 82 y.o.   MRN: [id number]   LOS: 4 days   Subjective: Confused, disoriented   Objective: Vital signs in last 24 hours: Temp:  [97.4 F (36.3 C)-98.7 F (37.1 C)] 97.5 F (36.4 C) (07/05 0720) Pulse Rate:  [63-133] 112 (07/05 0720) Resp:  [15-26] 26 (07/05 0720) BP: (116-154)/(59-101) 128/95 mmHg (07/05 0720) SpO2:  [94 %-99 %] 95 % (07/05 0720) Last BM Date:  (pu unsure)   CT No air leak 49ml/24h @970ml    Physical Exam General appearance: no distress Resp: clear to auscultation bilaterally Cardio: Mild tachycardia GI: normal findings: bowel sounds normal and soft, non-tender   Assessment/Plan: Fall L rib FX 2-8 with PTX - CT to water seal, pulm toilet ABL anemia -- Improved ETOH abuse - CIWA, check NH3, check UA but confusion probably withdrawal Skin mass R shoulder - may F/U with me as outpatient FEN - Increase fluid somewhat VTE - SCD's, Lovenox Dispo - SNF when bed available    Lisette Abu, PA-C Pager: [phone] General Trauma PA Pager: [phone]  10/08/2015

## 2015-10-09 ENCOUNTER — Encounter (HOSPITAL_COMMUNITY): Payer: Self-pay | Admitting: Cardiology

## 2015-10-09 ENCOUNTER — Inpatient Hospital Stay (HOSPITAL_COMMUNITY): Payer: Medicare PPO

## 2015-10-09 DIAGNOSIS — S2249XS Multiple fractures of ribs, unspecified side, sequela: Secondary | ICD-10-CM

## 2015-10-09 DIAGNOSIS — F1010 Alcohol abuse, uncomplicated: Secondary | ICD-10-CM

## 2015-10-09 DIAGNOSIS — I471 Supraventricular tachycardia: Secondary | ICD-10-CM

## 2015-10-09 MED ORDER — METOPROLOL TARTRATE 50 MG PO TABS
50.0000 mg | ORAL_TABLET | Freq: Two times a day (BID) | ORAL | Status: DC
  Administered 2015-10-09 (×2): 50 mg via ORAL
  Filled 2015-10-09 (×3): qty 1

## 2015-10-09 MED ORDER — IPRATROPIUM-ALBUTEROL 0.5-2.5 (3) MG/3ML IN SOLN
3.0000 mL | Freq: Four times a day (QID) | RESPIRATORY_TRACT | Status: DC
  Administered 2015-10-09: 3 mL via RESPIRATORY_TRACT
  Filled 2015-10-09: qty 3

## 2015-10-09 NOTE — Consult Note (Addendum)
Cardiology Consult    Patient ID: LEELU NISLY MRN: [id number], DOB/AGE: 80/04/1926   Admit date: 10/04/2015 Date of Consult: 10/09/2015  Primary Physician: No PCP Per Patient Primary Cardiologist: New Requesting Provider: Dr. Grandville Silos Reason for Consultation: Irregular Heart Rate  Patient Profile    82 yo female with PMH of Arthritis who presented to [facility] after a fall at home and found to have multiple ribs fractures and pneumothorax. Transferred to [facility] for admission.   Past Medical History   Past Medical History  Diagnosis Date  . Arthritis     Past Surgical History  Procedure Laterality Date  . Abdominal hysterectomy       Allergies  No Known Allergies  History of Present Illness    Mrs. Nevils is a 82 yo female with PMH of arthritis who by family report states she is a fairly healthy female, but does not normally go to PCP on a regular basis. The patient currently lives independently in her home, with her daughter close by who assist as needed. States she is functionally independent, and has only been admitted to the hospital maybe once in the past 50 years.  The patient denies ever having had any chest pain shortness of breath dyspnea on exertion dizziness lightheadedness or palpitations prior to this admission. She initially presented to [facility] after tripping and falling in her home and sustaining left rib fractures 2-8 with associated pneumothorax. She was transferred to [facility] under the care of trauma services, where ultimately her left chest tube that was placed outside the facility was replaced by trauma surgeon at [facility]. While undergoing care here [facility] she was noted to develop an elevated irregular heart rhythm along with hypertension, and initially placed on metoprolol 12.5 mg BID. She continued to have episodes of elevated heart rate ultimately requiring titration up of her metoprolol, along with doses of IV metoprolol. Heart rates  were noted to be as high as 170 prior to treatment.  In talking with her daughter, she reports that the patient does drink multiple alcoholic drinks on a daily basis. Daughter denies patient ever having had a diagnosis of atrial fibrillation, or any other heart arrhythmia in the past. Patient did have her chest tube removed as of yesterday, and has been tolerating well. She is currently resting comfortably in the bed. Most recent chest x-ray shows no signs of cardiomegaly, or edema, but demonstrates improvement of left apical pneumothorax. Most recent labs show creatinine of 0.62, hemoglobin was initial drop from 12.2>> 8.7>> 10.0, initial troponin negative.  Inpatient Medications    . docusate sodium  100 mg Oral BID  . enoxaparin (LOVENOX) injection  40 mg Subcutaneous Q24H  . folic acid  1 mg Oral Daily  . metoprolol tartrate  50 mg Oral BID  . multivitamin with minerals  1 tablet Oral Daily  . polyethylene glycol  17 g Oral Daily  . thiamine  100 mg Oral Daily    Family History    History reviewed. No pertinent family history. Daughter states no early CAD family history. Mother lived into her 43's.   Social History    Social History   Social History  . Marital Status: Married    Spouse Name: N/A  . Number of Children: N/A  . Years of Education: N/A   Occupational History  . Not on file.   Social History Main Topics  . Smoking status: Never Smoker   . Smokeless tobacco: Not on file  .  Alcohol Use: 12.6 oz/week    21 Shots of liquor per week  . Drug Use: Not on file  . Sexual Activity: Not on file   Other Topics Concern  . Not on file   Social History Narrative     Review of Systems    General:  No chills, fever, night sweats or weight changes.  Cardiovascular: See HPI Dermatological: No rash, lesions/masses Respiratory: No cough, dyspnea Urologic: No hematuria, dysuria Abdominal:   No nausea, vomiting, diarrhea, bright red blood per rectum, melena, or  hematemesis Neurologic:  No visual changes, wkns, changes in mental status, +anxiety All other systems reviewed and are otherwise negative except as noted above.  Physical Exam    Blood pressure 168/79, pulse 100, temperature 98.1 F (36.7 C), temperature source Oral, resp. rate 25, height 5\' 1"  (1.549 m), weight 99 lb 3.3 oz (45 kg), SpO2 97 %.  General: Pleasant Frail older female , NAD Psych: Normal affect. Neuro: Alert and oriented X 3. Moves all extremities spontaneously. HEENT: Normal  Neck: Supple without bruits or JVD. Lungs:  Resp regular and unlabored, Diminished bilaterally. Bruising to left chest wall Heart: tachy no s3, s4, or murmurs. Abdomen: Soft, non-tender, non-distended, BS + x 4.  Extremities: No clubbing, cyanosis or edema. DP/PT/Radials 2+ and equal bilaterally.  Labs    Troponin (Point of Care Test) No results for input(s): TROPIPOC in the last 72 hours. No results for input(s): CKTOTAL, CKMB, TROPONINI in the last 72 hours. Lab Results  Component Value Date   WBC 6.1 10/08/2015   HGB 10.0* 10/08/2015   HCT 30.0* 10/08/2015   MCV 105.6* 10/08/2015   PLT 252 10/08/2015    Recent Labs Lab 10/08/15 0907  NA 136  K 4.0  CL 106  CO2 20*  BUN 6  CREATININE 0.62  CALCIUM 8.6*  GLUCOSE 101*   No results found for: CHOL, HDL, LDLCALC, TRIG No results found for: [facility]   Radiology Studies    Dg Chest 2 View  10/03/2015  CLINICAL DATA:  Unwitnessed fall. EXAM: CHEST  2 VIEW COMPARISON:  None. FINDINGS: There are numerous left rib fractures, involving at least the fourth through ninth ribs posteriorly. Most of the fractures are displaced and overriding. There is a left pneumothorax, approximately 20% by volume. There is extensive left hemi thorax subcutaneous emphysema. The right lung is well expanded and clear. Pulmonary vasculature is normal. Mediastinal contours are normal. There are remote healed fracture deformities of the clavicles and humeri  bilaterally. IMPRESSION: 1. Left pneumothorax, approximately 20% by volume. 2. Extensive left subcutaneous emphysema. 3. Numerous left rib fractures, displaced and overriding. These results were called by telephone at the time of interpretation on 10/03/2015 at 10:31 pm to Dr. Nance Pear , who verbally acknowledged these results. Electronically Signed   By: Andreas Newport M.D.   On: 10/03/2015 22:33   Ct Head Wo Contrast  10/03/2015  CLINICAL DATA:  82 year old female with fall. EXAM: CT HEAD WITHOUT CONTRAST TECHNIQUE: Contiguous axial images were obtained from the base of the skull through the vertex without intravenous contrast. COMPARISON:  CT dated 02/20/2013 FINDINGS: There is moderate age-related atrophy and chronic microvascular ischemic changes. There is no acute intracranial hemorrhage. No mass effect or midline shift noted. The visualized paranasal sinuses and mastoid air cells are clear. The calvarium is intact. IMPRESSION: No acute intracranial hemorrhage. Age-related atrophy and chronic microvascular ischemic disease. Electronically Signed   By: Anner Crete M.D.   On: 10/03/2015 22:22  Ct Chest W Contrast  10/03/2015  CLINICAL DATA:  Unwitnessed fall. EXAM: CT CHEST, ABDOMEN, AND PELVIS WITH CONTRAST TECHNIQUE: Multidetector CT imaging of the chest, abdomen and pelvis was performed following the standard protocol during bolus administration of intravenous contrast. CONTRAST:  75 mL Isovue 370 intravenous COMPARISON:  Radiographs 10/03/2015 FINDINGS: CT CHEST There are displaced fractures of the left second through eighth ribs. There is a moderate left pneumothorax, 15-20% by volume. There is left subcutaneous emphysema. There is a small left pleural effusion. There is mild consolidation or contusion of the posterior left lower lobe. The right lung is expanded and clear. The airways are patent. There is no intrathoracic vascular injury. Vertebral column and sternum are intact. There  is mild compression of T9 which appears to be chronic. CT ABDOMEN AND PELVIS There are intact appearances of the liver, spleen, pancreas, adrenals and kidneys. There is mild diffuse fatty infiltration of the liver. Bowel is remarkable only for extensive colonic diverticulosis. There is no peritoneal blood or free air. There is no intra-abdominal vascular injury. There is moderate compression of L1 which is of indeterminate age but more likely remote. Mild remote compression of L5. No evidence of acute fracture involving the pelvis or hips. Remote healed left pubic ramus fracture deformities are present. IMPRESSION: 1. Acute displaced fractures of the left second through eighth ribs. Moderate left pneumothorax, 15-20% by volume. 2. Small left pleural effusion and left lower lobe atelectasis or contusion. 3. No acute traumatic injury in the abdomen or pelvis. 4. Moderate compression of L1, indeterminate age but more likely remote. 5. Chronic appearing compressions of T9 and L5. Remote healed left pubic ramus fracture deformities. Remote posttraumatic deformities of the proximal humerus bilaterally, incompletely united on the left. Electronically Signed   By: Andreas Newport M.D.   On: 10/03/2015 23:26   Ct Abdomen Pelvis W Contrast  10/03/2015  CLINICAL DATA:  Unwitnessed fall. EXAM: CT CHEST, ABDOMEN, AND PELVIS WITH CONTRAST TECHNIQUE: Multidetector CT imaging of the chest, abdomen and pelvis was performed following the standard protocol during bolus administration of intravenous contrast. CONTRAST:  75 mL Isovue 370 intravenous COMPARISON:  Radiographs 10/03/2015 FINDINGS: CT CHEST There are displaced fractures of the left second through eighth ribs. There is a moderate left pneumothorax, 15-20% by volume. There is left subcutaneous emphysema. There is a small left pleural effusion. There is mild consolidation or contusion of the posterior left lower lobe. The right lung is expanded and clear. The airways are  patent. There is no intrathoracic vascular injury. Vertebral column and sternum are intact. There is mild compression of T9 which appears to be chronic. CT ABDOMEN AND PELVIS There are intact appearances of the liver, spleen, pancreas, adrenals and kidneys. There is mild diffuse fatty infiltration of the liver. Bowel is remarkable only for extensive colonic diverticulosis. There is no peritoneal blood or free air. There is no intra-abdominal vascular injury. There is moderate compression of L1 which is of indeterminate age but more likely remote. Mild remote compression of L5. No evidence of acute fracture involving the pelvis or hips. Remote healed left pubic ramus fracture deformities are present. IMPRESSION: 1. Acute displaced fractures of the left second through eighth ribs. Moderate left pneumothorax, 15-20% by volume. 2. Small left pleural effusion and left lower lobe atelectasis or contusion. 3. No acute traumatic injury in the abdomen or pelvis. 4. Moderate compression of L1, indeterminate age but more likely remote. 5. Chronic appearing compressions of T9 and L5. Remote healed left  pubic ramus fracture deformities. Remote posttraumatic deformities of the proximal humerus bilaterally, incompletely united on the left. Electronically Signed   By: Andreas Newport M.D.   On: 10/03/2015 23:26   Dg Chest Port 1 View  10/09/2015  CLINICAL DATA:  Dramatic hemo pneumothorax. Left-sided rib fractures. EXAM: PORTABLE CHEST 1 VIEW COMPARISON:  10/08/2015, 10/03/2015. FINDINGS: Interim removal of left chest tube. Tiny left apical pneumothorax is again noted and stable. Heart size stable. Low lung volumes with bibasilar atelectasis and/or infiltrates. Small left pleural effusion. Multiple displaced left rib fractures again noted. Humeral fractures and clavicular fractures again noted, these may be old. Stable mild left chest wall subcutaneous emphysema. IMPRESSION: 1. Interim removal of left chest tube. Stable tiny  left apical pneumothorax. Stable mild left chest wall subcutaneous emphysema . 2. Low lung volumes with bibasilar atelectasis and/or infiltrates. Small left pleural effusion. 3. Multiple displaced left rib fractures again noted. Humeral fracture is and clavicular fractures again noted, these may be old. Electronically Signed   By: Marcello Moores  Register   On: 10/09/2015 07:12    ECG & Cardiac Imaging    EKG: SR Rate-114 PACs   Assessment & Plan    Mrs. Moeller is a 82 yo female with PMH of arthritis who by family report states she is a fairly healthy female, but does not normally go to PCP on a regular basis. The patient currently lives independently in her home, with her daughter close by who assist as needed. States she is functionally independent, and has only been admitted to the hospital maybe once in the past 50 years.  The patient denies ever having had any chest pain shortness of breath dyspnea on exertion dizziness lightheadedness or palpitations prior to this admission. She initially presented to [facility] after tripping and falling in her home and sustaining left rib fractures 2-8 with associated pneumothorax. She was transferred to [facility] under the care of trauma services, where ultimately her left chest tube that was placed outside the facility was replaced by trauma surgeon at [facility]. While undergoing care here [facility] she was noted to develop an elevated irregular heart rhythm along with hypertension, and initially placed on metoprolol 12.5 mg BID. She continued to have episodes of elevated heart rate ultimately requiring titration up of her metoprolol, along with doses of IV metoprolol. Heart rates were noted to be as high as 170 prior to treatment.  1. Irregular HR: During her admission the patient started to have episodes of elevated irregular heart rate on telemetry. Was initially started on metoprolol 12.5 mg twice a day, with titration up to 50 mg twice a day. Also was given  injections of IV Lopressor for episodes where her heart rate increased to rates in the 170s. During these episodes patient was asymptomatic, without any chest pain, palpitations, dizziness, or lightheadedness. Review of telemetry it appears that she mostly maintains a sinus rhythm with frequent PACs, and PVCs. Does exhibit one episode of questionable atrial fibrillation/multifocal atrial tachycardia. Since her metoprolol has been titrated up to 50 mg twice a day earlier this a.m. her heart rate appears to have improved with rates currently in the 110s.  -- Would continue current dose of lopressor at this time, even she has had a general downward trend of her overall heart rate, may need to add on Diltiazem if her rates remain uncontrolled.  -- Did appear to have one episode of possible atrial fibrillation/multifocal atrial tachycardia with rate in the 170s in review of  telemetry. At this time would defer possible anticoagulation given she is only had one noted episode of this arrhythmia, and currently undergoing treatment for multiple rib fractures and pneumothorax. She continues to have episodes of atrial fibrillation will readdress these for future and her coagulation. -- Of note daughter reports patient has had multiple alcoholic drinks a day. Patiently is currently on CIWA protocol. This along with her reported anxiety of being in the hospital maybe contributing to her abnormal rhythms. -- Will reassess heart rate tomorrow and potentially order 2-D echocardiogram to evaluate EF.   2. Rib Fractures/pnuemothorax: s/p chest tube removal, per trauma services   Signed, Reino Bellis, NP-C Pager [phone] 10/09/2015, 12:57 PM  Personally seen and examined. Agree with above.  82 year old post fall, rib fx, PTX, anemia with tachy-arrhythmia.  - Telemetry carefully reviewed. There are short bursts of HR 170 that is irreg irreg, can not exclude AFIB, however the majority of tachycardia is sinus with  frequent PAC's.   - Heavy ETOH use at home, DT's may be playing a role as well as underlying pain, agitation.  - Since increasing metoprolol to 50 BID, HR has recently improved. Continue. Daughter is skeptical about future pill use.   - Risk outweighs benefit of anticoagulation (if indeed she had short bursts of AFIB). Discussed as well with daughter potential CVA risks.   - Currently she is sleepy, soft restraints in place.  Will follow.  Candee Furbish, MD

## 2015-10-09 NOTE — Care Management Important Message (Signed)
Important Message  Patient Details  Name: Lisa Chan MRN: [id number] Date of Birth: Mar 24, 1927   Medicare Important Message Given:  Yes    Loann Quill 10/09/2015, 10:32 AM

## 2015-10-09 NOTE — Progress Notes (Signed)
   10/09/15 1400  Clinical Encounter Type  Visited With Family;Patient not available  Visit Type Initial  Referral From Nurse  Consult/Referral To Chaplain  Spiritual Encounters  Spiritual Needs Other (Comment);Grief support (AD request)  Stress Factors  Patient Stress Factors Loss of control  Family Stress Factors Loss of control  CHP responded to [facility] for AD.  Patient was sleeping so CHP explained protocol to daughter.  She will share with mother when she is alert.  CHP provided emotional support to daughter. Roe Coombs Today

## 2015-10-09 NOTE — Progress Notes (Signed)
Dr. Grandville Silos notified of morning EKG rhythm, response to metoprolol, and BPs.  New orders received.

## 2015-10-09 NOTE — Progress Notes (Signed)
Hilbert Odor, PA notified of the a-fib and heart rate of 130s this a.m.; prn dose of metoprolol given.  Currently in SR with HR of 97.

## 2015-10-09 NOTE — Progress Notes (Signed)
Dr. Grandville Silos notified of patient's elevated HR in the 130-140s a fib and that a prn dose of metoprolol.  Also, that the patient had told me with her daughter present, that she does NOT want life support machines in the event that she were to need a breathing machine.  Dr. Grandville Silos will come by and talk with patient and daughter.

## 2015-10-09 NOTE — Progress Notes (Signed)
Patient ID: Lisa Chan, female   DOB: [birth year], 82 y.o.   MRN: [id number]    Subjective: Eating with assist, no complaint. RN reports HR has been intermittently elevated and irregular overnight. Improves after lopressor.  Objective: Vital signs in last 24 hours: Temp:  [97.6 F (36.4 C)-98.9 F (37.2 C)] 97.9 F (36.6 C) (07/06 0710) Pulse Rate:  [87-166] 122 (07/06 0840) Resp:  [15-27] 25 (07/06 0840) BP: (115-178)/(54-109) 159/88 mmHg (07/06 0840) SpO2:  [96 %-98 %] 96 % (07/06 0840) Last BM Date:  (pt unsure)  Intake/Output from previous day: 07/05 0701 - 07/06 0700 In: 1142.5 [P.O.:260; I.V.:882.5] Out: 235 [Urine:125; Chest Tube:110] Intake/Output this shift:    General appearance: cooperative Resp: clear to auscultation bilaterally Chest wall: left sided chest wall tenderness Cardio: irregularly irregular rhythm and 110s GI: soft, NT, ND Extremities: skin mass R shoulder  Lab Results: CBC   Recent Labs  10/07/15 0436 10/08/15 0907  WBC 5.8 6.1  HGB 10.5* 10.0*  HCT 32.5* 30.0*  PLT 255 252   BMET  Recent Labs  10/08/15 0907  NA 136  K 4.0  CL 106  CO2 20*  GLUCOSE 101*  BUN 6  CREATININE 0.62  CALCIUM 8.6*   PT/INR No results for input(s): LABPROT, INR in the last 72 hours. ABG No results for input(s): PHART, HCO3 in the last 72 hours.  Invalid input(s): PCO2, PO2  Studies/Results: Dg Chest Port 1 View  10/09/2015  CLINICAL DATA:  Dramatic hemo pneumothorax. Left-sided rib fractures. EXAM: PORTABLE CHEST 1 VIEW COMPARISON:  10/08/2015, 10/03/2015. FINDINGS: Interim removal of left chest tube. Tiny left apical pneumothorax is again noted and stable. Heart size stable. Low lung volumes with bibasilar atelectasis and/or infiltrates. Small left pleural effusion. Multiple displaced left rib fractures again noted. Humeral fractures and clavicular fractures again noted, these may be old. Stable mild left chest wall subcutaneous emphysema.  IMPRESSION: 1. Interim removal of left chest tube. Stable tiny left apical pneumothorax. Stable mild left chest wall subcutaneous emphysema . 2. Low lung volumes with bibasilar atelectasis and/or infiltrates. Small left pleural effusion. 3. Multiple displaced left rib fractures again noted. Humeral fracture is and clavicular fractures again noted, these may be old. Electronically Signed   By: Marcello Moores  Register   On: 10/09/2015 07:12   Dg Chest Port 1 View  10/08/2015  CLINICAL DATA:  Traumatic left pneumothorax due to fall and subsequent rib fractures. EXAM: PORTABLE CHEST 1 VIEW COMPARISON:  Portable chest x-ray of October 07, 2015 FINDINGS: A approximately 10-15% left apical pneumothorax is visible and relatively stable. There is pleural fluid at the left lung base. The retrocardiac region remains dense. The left-sided chest tube is in stable position. Multiple posterior and lateral rib fractures are again demonstrated. There is no mediastinal shift. The left lung is well-expanded and clear. The heart is normal in size. There is no pulmonary vascular congestion. There is calcification in the wall of the aortic arch. Chronic deformity of the humeral heads and necks is present bilaterally. There is deformity of the distal clavicles bilaterally also stable. IMPRESSION: 1. 10-15% left apical pneumothorax. The left chest tube is in stable position. Small left pleural effusion. The right lung is clear. 2. Multiple left-sided rib fractures. Previous fractures of the proximal humeri and of the clavicles. 3. Aortic atherosclerosis. Electronically Signed   By: David  Martinique M.D.   On: 10/08/2015 08:28    Anti-infectives: Anti-infectives    None      Assessment/Plan:  Fall L rib FX 2-8 with PTX - CT out and CXR stable ABL anemia - Improved ETOH abuse - CIWA, NH3 25 Tachycardia/suspect PAF - increase Lopressor to 50mg  BID, EKG, cardiology consult Skin mass R shoulder - may F/U with Dr. Grandville Silos at [place] - eating  with assist VTE - SCD's, [place] SDU   LOS: 5 days    Georganna Skeans, MD, MPH, FACS Trauma: [phone] General Surgery: [phone]  10/09/2015

## 2015-10-09 NOTE — Progress Notes (Signed)
Patient ID: Lisa Chan, female   DOB: [birth year], 82 y.o.   MRN: [id number] RN notified me that the patient and her daughter wanted to discuss goals of care. I discussed the option of DNR and DNI with them. [name] is unsure at this time. I ordered duoneb treatments for now as she has some wheezing. Appreciate cardiology evaluation. Georganna Skeans, MD, MPH, FACS Trauma: [phone] General Surgery: [phone]

## 2015-10-10 ENCOUNTER — Encounter
Admission: RE | Admit: 2015-10-10 | Discharge: 2015-10-10 | Disposition: A | Discharge status: Home / Self Care | Payer: Medicare PPO | Source: Ambulatory Visit | Attending: Internal Medicine | Admitting: Internal Medicine

## 2015-10-10 LAB — BASIC METABOLIC PANEL
Anion gap: 7 (ref 5–15)
CHLORIDE: 108 mmol/L (ref 101–111)
CO2: 23 mmol/L (ref 22–32)
CREATININE: 0.61 mg/dL (ref 0.44–1.00)
Calcium: 8.3 mg/dL — ABNORMAL LOW (ref 8.9–10.3)
GFR calc Af Amer: >60 mL/min (ref >60)
GFR calc non Af Amer: >60 mL/min (ref >60)
GLUCOSE: 105 mg/dL — AB (ref 65–99)
POTASSIUM: 3.4 mmol/L — AB (ref 3.5–5.1)
SODIUM: 138 mmol/L (ref 135–145)

## 2015-10-10 LAB — MAGNESIUM: Magnesium: 1.4 mg/dL — ABNORMAL LOW (ref 1.7–2.4)

## 2015-10-10 MED ORDER — TRAMADOL HCL 50 MG PO TABS
50.0000 mg | ORAL_TABLET | Freq: Four times a day (QID) | ORAL | Status: DC | PRN
  Filled 2015-10-10: qty 1

## 2015-10-10 MED ORDER — IPRATROPIUM-ALBUTEROL 0.5-2.5 (3) MG/3ML IN SOLN
3.0000 mL | Freq: Three times a day (TID) | RESPIRATORY_TRACT | Status: DC
  Administered 2015-10-10 – 2015-10-11 (×3): 3 mL via RESPIRATORY_TRACT
  Filled 2015-10-10 (×4): qty 3

## 2015-10-10 MED ORDER — METOPROLOL TARTRATE 100 MG PO TABS
100.0000 mg | ORAL_TABLET | Freq: Two times a day (BID) | ORAL | Status: DC
  Administered 2015-10-10 – 2015-10-12 (×5): 100 mg via ORAL
  Filled 2015-10-10 (×5): qty 1

## 2015-10-10 MED ORDER — POTASSIUM CHLORIDE CRYS ER 20 MEQ PO TBCR
20.0000 meq | EXTENDED_RELEASE_TABLET | Freq: Two times a day (BID) | ORAL | Status: DC
  Administered 2015-10-10 – 2015-10-12 (×5): 20 meq via ORAL
  Filled 2015-10-10 (×5): qty 1

## 2015-10-10 MED ORDER — LORAZEPAM 1 MG PO TABS: 1.0000 mg | ORAL_TABLET | Freq: Four times a day (QID) | ORAL | Status: DC | PRN

## 2015-10-10 MED ORDER — DILTIAZEM HCL ER COATED BEADS 120 MG PO CP24
120.0000 mg | ORAL_CAPSULE | Freq: Every day | ORAL | Status: DC
  Administered 2015-10-10 – 2015-10-11 (×2): 120 mg via ORAL
  Filled 2015-10-10 (×2): qty 1

## 2015-10-10 MED ORDER — LORAZEPAM 2 MG/ML IJ SOLN: 1.0000 mg | Freq: Four times a day (QID) | INTRAMUSCULAR | Status: DC | PRN

## 2015-10-10 MED ORDER — DOCUSATE SODIUM 100 MG PO CAPS
200.0000 mg | ORAL_CAPSULE | Freq: Two times a day (BID) | ORAL | Status: DC
  Administered 2015-10-10 – 2015-10-12 (×5): 200 mg via ORAL
  Filled 2015-10-10 (×5): qty 2

## 2015-10-10 NOTE — Progress Notes (Signed)
Occupational Therapy Treatment Patient Details Name: Lisa Chan MRN: [id number] DOB: June 16, 1926 Today's Date: 10/10/2015    History of present illness 82 y.o. female admitted to [facility] on 10/04/15 s/p fall wiht rib fxs and pneumopthorax.  L chest tube placed 6/30, and revised 10/04/15.  Pt with significant PMhx of arthritis.     OT comments  Pt now for discharge home with daughter.  Daughter present during OT session.  Pt requires max A for transfer to recliner and total A for peri care due to urinary incontinence.  She fatigues quite rapidly with activity.  Daughter feels confident in her ability to provide necessary level of care, and reports she will have other family members available to assist.  Recommend hospital bed, wheelchair, BSC, hoyer lift, and ambulance transport.  Also would benefit from HHOT,HHPT  and a HHaide  Follow Up Recommendations  Home health OT;Supervision/Assistance - 24 hour    Equipment Recommendations  3 in 1 bedside comode;Wheelchair (measurements OT);Hospital bed;Other (comment) (hoyer lift )    Recommendations for Other Services      Precautions / Restrictions Precautions Precautions: Fall Precaution Comments: pt reports significant PMHx of multiple falls, also verbalized fear of falling.       Mobility Bed Mobility Overal bed mobility: Needs Assistance Bed Mobility: Supine to Sit     Supine to sit: Mod assist     General bed mobility comments: assist to lift trunk and to scoot to EOB   Transfers Overall transfer level: Needs assistance Equipment used: Rolling walker (2 wheeled);None Transfers: Sit to/from [facility] Sit to Stand: Mod assist;Max assist Stand pivot transfers: Max assist       General transfer comment: Pt moved sit to stand with mod A  from recliner, and max A from bed.  She requires max A to maintain standing balance, and max A to pivot feet     Balance Overall balance assessment: Needs  assistance Sitting-balance support: Feet supported Sitting balance-Leahy Scale: Poor Sitting balance - Comments: min A to maintain EOB sitting, but at times required min guard assist  Postural control: Posterior lean Standing balance support: Bilateral upper extremity supported Standing balance-Leahy Scale: Poor Standing balance comment: requires max A for static standing                    ADL Overall ADL's : Needs assistance/impaired                         Toilet Transfer: Maximal assistance;Stand-pivot;BSC;RW   Toileting- Clothing Manipulation and Hygiene: Total assistance;Sit to/from stand Toileting - Clothing Manipulation Details (indicate cue type and reason): Pt incontinent of urine and assisted with peri care      Functional mobility during ADLs: Maximal assistance;Rolling walker General ADL Comments: Daughter, Lisa Chan present, and reports plan is now for pt to discharge home with her and her family providing 24 hour assist.  Discussed needed DME, use of draw sheet, need for turning every 2 hours when in bed.  Recommendation for hoyer lift.  Did not have daughter attempt transfers today due to pt fatigue level       Vision                     Perception     Praxis      Cognition   Behavior During Therapy: [facility] for tasks assessed/performed Overall Cognitive Status: Impaired/Different from baseline Area of Impairment: Orientation;Attention;Memory;Following commands;Safety/judgement;Problem solving Orientation Level:  Disoriented to;Time;Situation Current Attention Level: Selective Memory: Decreased short-term memory  Following Commands: Follows one step commands consistently Safety/Judgement: Decreased awareness of safety;Decreased awareness of deficits   Problem Solving: Slow processing;Decreased initiation;Difficulty sequencing;Requires verbal cues;Requires tactile cues General Comments: confusion increases as she fatigues      Extremity/Trunk Assessment               Exercises     Shoulder Instructions       General Comments      Pertinent Vitals/ Pain       Pain Assessment: Faces Faces Pain Scale: Hurts a little bit Pain Location: generalized  Pain Descriptors / Indicators: Grimacing  Home Living                                          Prior Functioning/Environment              Frequency Min 2X/week     Progress Toward Goals  OT Goals(current goals can now be found in the care plan section)  Progress towards OT goals: Progressing toward goals  ADL Goals Pt Will Perform Grooming: with min guard assist;standing Pt Will Perform Lower Body Bathing: with min assist;sit to/from stand Pt Will Perform Lower Body Dressing: with min assist;sit to/from stand Pt Will Transfer to Toilet: with min assist;ambulating;bedside commode  Plan Discharge plan needs to be updated    Co-evaluation                 End of Session Equipment Utilized During Treatment: Oxygen;Rolling walker;Gait belt   Activity Tolerance Patient limited by fatigue   Patient Left in chair;with call bell/phone within reach;with chair alarm set;with family/visitor present   Nurse Communication Mobility status;Need for lift equipment        Time: 1423-1521 OT Time Calculation (min): 58 min  Charges: OT General Charges $OT Visit: 1 Procedure OT Treatments $Self Care/Home Management : 53-67 mins  Lisa Chan M 10/10/2015, 6:51 PM

## 2015-10-10 NOTE — Progress Notes (Signed)
Speech Language Pathology Treatment: Dysphagia  Patient Details Name: ANGALA HANSCOM MRN: [id number] DOB: [birth year] Today's Date: 10/10/2015 Time: 0900-0920 SLP Time Calculation (min) (ACUTE ONLY): 20 min  Assessment / Plan / Recommendation Clinical Impression  Pt seen following am meal, taking meds in puree with RN. Needed some verbal cueing and liquid wash to transit uncrushable capsule. Tolerate straw sips of miralax with 1 cough out of 10 sips, eliminated after repositioning. Even with continuous sips and moderate RR pt was able to consume sips when fully upright. Cup sips still advisable, but if pt is positioned fully upright straws can be used. Expect function to improve with solids and liquids as RR slows with further treatment.    HPI HPI: 82 y.o. female admitted to [facility] on 10/04/15 s/p fall wiht rib fxs and pneumopthorax. L chest tube placed 6/30, and revised 10/04/15. Pt with significant PMhx of arthritis.BSE ordered as RN concerned re: pooling of food in the mouth.      SLP Plan  Continue with current plan of care     Recommendations  Diet recommendations: Dysphagia 2 (fine chop);Thin liquid Liquids provided via: Cup;Straw (straw ok if needed) Medication Administration: Crushed with puree Supervision: Staff to assist with self feeding;Full supervision/cueing for compensatory strategies Compensations: Minimize environmental distractions;Slow rate;Small sips/bites;Follow solids with liquid Postural Changes and/or Swallow Maneuvers: Seated upright 90 degrees;Upright 30-60 min after meal             Plan: Continue with current plan of care     [place] Tiah Heckel, MA CCC-SLP [id number]  Lynann Beaver 10/10/2015, 9:58 AM

## 2015-10-10 NOTE — Progress Notes (Signed)
Report called pt to transfer to 6N18 via bed with belongings after done eating breakfast.

## 2015-10-10 NOTE — Clinical Social Work Note (Signed)
CSW received referral for SNF.  Case discussed with case manager, and plan is to discharge home.  CSW to sign off please re-consult if social work needs arise.  Taten Merrow R. Lochlann Mastrangelo, MSW, LCSWA [phone]  

## 2015-10-10 NOTE — Care Management Note (Signed)
Case Management Note  Patient Details  Name: Lisa Chan MRN: [id number] Date of Birth: 1926-08-19  Subjective/Objective:                    Action/Plan:  Was informed by social work , patient refusing SNF and going home with daughter [name] . [name] requested hospital bed and would be in patient's room at 1300 to discuss home health.   At present daughter not at bedside , other family members stated [name] will be here at 1430 . Confirmed with patient she understands PT recommending SNF , however, she wants to go home and daughter lives across the street , but daughter will move in with patient when patient discharged.  Spoke with [facility] with trauma service , discharge planned for tomorrow and she will let Dr Hulen Skains know patient wants home health and hospital bed. Will need orders and face to face.  Will follow up later to see if daughter present. Expected Discharge Date:  10/09/15               Expected Discharge Plan:  [place]  In-House Referral:     Discharge planning Services     Post Acute Care Choice:    Choice offered to:  Patient  DME Arranged:    DME Agency:     HH Arranged:    [place] Agency:     Status of Service:  In process, will continue to follow  If discussed at Long Length of Stay Meetings, dates discussed:    Additional Comments:  [name], RN 10/10/2015, 2:07 PM

## 2015-10-10 NOTE — Progress Notes (Signed)
After lopressor iv given

## 2015-10-10 NOTE — Progress Notes (Signed)
Patient ID: Lisa Chan, female   DOB: 08/19/1926, 82 y.o.   MRN: [id number]   LOS: 6 days   Subjective: Pt has decided to be DNR, will make change. Also says she and dtr have decided on going home.   Objective: Vital signs in last 24 hours: Temp:  [98 F (36.7 C)-98.8 F (37.1 C)] 98.2 F (36.8 C) (07/07 0315) Pulse Rate:  [88-122] 104 (07/07 0315) Resp:  [18-25] 18 (07/07 0315) BP: (144-168)/(49-88) 157/70 mmHg (07/07 0315) SpO2:  [96 %-98 %] 98 % (07/07 0315) Last BM Date:  (pt unsure)   Laboratory  BMET  Recent Labs  10/08/15 0907 10/10/15 0454  NA 136 138  K 4.0 3.4*  CL 106 108  CO2 20* 23  GLUCOSE 101* 105*  BUN 6 <5*  CREATININE 0.62 0.61  CALCIUM 8.6* 8.3*    Physical Exam General appearance: alert and no distress Resp: clear to auscultation bilaterally Cardio: irregularly irregular rhythm and tachycardia GI: normal findings: bowel sounds normal and soft, non-tender   Assessment/Plan: Fall L rib FX 2-8 with PTX - Pulmonary toilet ABL anemia - Improved ETOH abuse - CIWA, NH3 25 Tachycardia/suspect PAF - Appreciate cardiology consult, no anticoagulation at this time Skin mass R shoulder - may F/U with Dr. Grandville Silos at [place] - Increase bowel regimen, add K+ VTE - SCD's, Lovenox Dispo - Transfer to floor, SNF vs home with dtr    Lisette Abu, PA-C Pager: [phone] General Trauma PA Pager: [phone]  10/10/2015

## 2015-10-10 NOTE — Progress Notes (Addendum)
Cardiologist: Marlou Porch Subjective:  No symptoms, HR increased once again to 170, irreg, likely afib. Sitting up eating breakfast. Yesterday was agitated.   Objective:  Vital Signs in the last 24 hours: Temp:  [98 F (36.7 C)-98.8 F (37.1 C)] 98.2 F (36.8 C) (07/07 0711) Pulse Rate:  [88-143] 143 (07/07 0711) Resp:  [18-25] 20 (07/07 0711) BP: (144-168)/(49-82) 168/82 mmHg (07/07 0711) SpO2:  [95 %-98 %] 95 % (07/07 0711)  Intake/Output from previous day: 07/06 0701 - 07/07 0700 In: 1105 [P.O.:280; I.V.:825] Out: 525 [Urine:525]   Physical Exam: General: Elderly, in no acute distress. Frail Head:  Normocephalic and atraumatic. Lungs: Clear to auscultation and percussion. Heart: Irreg irreg tachy.  No murmur, rubs or gallops.  Abdomen: soft, non-tender, positive bowel sounds. Extremities: No clubbing or cyanosis. No edema. Neurologic: Alert in NAD    Lab Results:  Recent Labs  10/08/15 0907  WBC 6.1  HGB 10.0*  PLT 252    Recent Labs  10/08/15 0907 10/10/15 0454  NA 136 138  K 4.0 3.4*  CL 106 108  CO2 20* 23  GLUCOSE 101* 105*  BUN 6 <5*  CREATININE 0.62 0.61    Imaging: Dg Chest Port 1 View  10/09/2015  CLINICAL DATA:  Dramatic hemo pneumothorax. Left-sided rib fractures. EXAM: PORTABLE CHEST 1 VIEW COMPARISON:  10/08/2015, 10/03/2015. FINDINGS: Interim removal of left chest tube. Tiny left apical pneumothorax is again noted and stable. Heart size stable. Low lung volumes with bibasilar atelectasis and/or infiltrates. Small left pleural effusion. Multiple displaced left rib fractures again noted. Humeral fractures and clavicular fractures again noted, these may be old. Stable mild left chest wall subcutaneous emphysema. IMPRESSION: 1. Interim removal of left chest tube. Stable tiny left apical pneumothorax. Stable mild left chest wall subcutaneous emphysema . 2. Low lung volumes with bibasilar atelectasis and/or infiltrates. Small left pleural effusion. 3.  Multiple displaced left rib fractures again noted. Humeral fracture is and clavicular fractures again noted, these may be old. Electronically Signed   By: Marcello Moores  Register   On: 10/09/2015 07:12   Personally viewed.   Telemetry: Mostly NSR, sinus tach with PAC. Now short burst of what appears to be AFIB 170.  Personally viewed.     Meds: Scheduled Meds: . diltiazem  120 mg Oral Daily  . docusate sodium  200 mg Oral BID  . enoxaparin (LOVENOX) injection  40 mg Subcutaneous Q24H  . folic acid  1 mg Oral Daily  . ipratropium-albuterol  3 mL Nebulization TID  . metoprolol tartrate  100 mg Oral BID  . multivitamin with minerals  1 tablet Oral Daily  . polyethylene glycol  17 g Oral Daily  . potassium chloride  20 mEq Oral BID  . thiamine  100 mg Oral Daily   Continuous Infusions:  PRN Meds:.acetaminophen, hydrALAZINE, LORazepam **OR** LORazepam, metoprolol, ondansetron **OR** ondansetron (ZOFRAN) IV, traMADol  Assessment/Plan:  Active Problems:   Multiple rib fractures   Fall   Acute blood loss anemia   Mass of skin of right shoulder   Alcohol abuse   Traumatic hemopneumothorax   Arrhythmia  SVT  - likely afib 170bpm  - IV metoprolol helping  - will increase metoprolol from 50 BID to 100 BID  - will start diltiazem CD 120 QD as well. Watch for bradycardia  - does not appear agitated at this time.   - Risk outweighs benefit of anticoagulation (if indeed she had short bursts of AFIB). Discussed as well with daughter potential  CVA risks.   ETOH use  - CIWA  - Drinks throughout day at home, heavy  Hypokalemia  - replete  Multiple rib fx from mechanical fall  - chest tube out  - pulm toilet  Candee Furbish 10/10/2015, 8:58 AM

## 2015-10-10 NOTE — Care Management Note (Signed)
Case Management Note  Patient Details  Name: Lisa Chan MRN: [id number] Date of Birth: 1927-01-01  Subjective/Objective:                    Action/Plan:  Discussed discharge planning with patient and daughter Lisa Chan at bedside ( PT present also) . PT recommending hospital bed , hoyer lift , 3 in 1 , wheelchair received orders for same . Planned discharge is for tomorrow 10-11-15 . [place] with [facility] aware and will have DME delivered before discharge Lisa Chan 's contact number provided) .   Confirmed face sheet information with Lisa Chan . Lisa Chan plans on moving in with patient when patient discharged . Patient will need transportation home at discharge. Bedside nurse aware to call CM on day of discharge to arrange same.   Choice provided for [facility], daughter wants [place].   Patient does not have a PCP . Explained patient or daughter can call number on insurance card to be provided with a list of providers in network . Provided Health Contact number for list of [place] MD's . Also explained if they know of a MD they can call MD office directly to see if he/she is in network with patient's insurance and accepting new patient's . Lisa Chan voiced understanding. Expected Discharge Date:  10/09/15               Expected Discharge Plan:  [place]  In-House Referral:     Discharge planning Services  CM Consult  Post Acute Care Choice:  Home Health, Durable Medical Equipment Choice offered to:  Patient, Adult Children  DME Arranged:  3-N-1, Walker rolling with seat, Hospital bed, Wheelchair manual DME Agency:  [place]:  PT, OT [facility] Agency:  [place]  Status of Service:  Completed, signed off  If discussed at [place] of Stay Meetings, dates discussed:    Additional Comments:  [name], RN 10/10/2015, 3:37 PM

## 2015-10-10 NOTE — Clinical Social Work Note (Signed)
Patient transferred from 3S to 6N. Handoff information given to 6N CSW.   This CSW signing off.  Lisa Chan, [place]

## 2015-10-10 NOTE — Progress Notes (Signed)
Per dr Gillian Shields ok to still transfer patient to 6N. Increased her po meds. To control HR.

## 2015-10-10 NOTE — Progress Notes (Signed)
Notified daugther angela ball on new room number and updated her on mom's health status.

## 2015-10-11 DIAGNOSIS — R000 Tachycardia, unspecified: Secondary | ICD-10-CM

## 2015-10-11 DIAGNOSIS — I4891 Unspecified atrial fibrillation: Secondary | ICD-10-CM

## 2015-10-11 DIAGNOSIS — I491 Atrial premature depolarization: Secondary | ICD-10-CM

## 2015-10-11 LAB — T4, FREE: Free T4: 1.09 ng/dL (ref 0.61–1.12)

## 2015-10-11 LAB — BASIC METABOLIC PANEL
Anion gap: 7 (ref 5–15)
CHLORIDE: 105 mmol/L (ref 101–111)
CO2: 25 mmol/L (ref 22–32)
CREATININE: 0.55 mg/dL (ref 0.44–1.00)
Calcium: 8.4 mg/dL — ABNORMAL LOW (ref 8.9–10.3)
GFR calc Af Amer: >60 mL/min (ref >60)
GFR calc non Af Amer: >60 mL/min (ref >60)
GLUCOSE: 100 mg/dL — AB (ref 65–99)
Potassium: 3.7 mmol/L (ref 3.5–5.1)
SODIUM: 137 mmol/L (ref 135–145)

## 2015-10-11 LAB — CBC
HCT: 31.5 % — ABNORMAL LOW (ref 36.0–46.0)
Hemoglobin: 10.3 g/dL — ABNORMAL LOW (ref 12.0–15.0)
MCH: 33.9 pg (ref 26.0–34.0)
MCHC: 32.7 g/dL (ref 30.0–36.0)
MCV: 103.6 fL — AB (ref 78.0–100.0)
PLATELETS: 418 10*3/uL — AB (ref 150–400)
RBC: 3.04 MIL/uL — ABNORMAL LOW (ref 3.87–5.11)
RDW: 13.8 % (ref 11.5–15.5)
WBC: 5.2 10*3/uL (ref 4.0–10.5)

## 2015-10-11 LAB — TSH: TSH: 6.829 u[IU]/mL — ABNORMAL HIGH (ref 0.350–4.500)

## 2015-10-11 MED ORDER — IPRATROPIUM-ALBUTEROL 0.5-2.5 (3) MG/3ML IN SOLN
3.0000 mL | Freq: Two times a day (BID) | RESPIRATORY_TRACT | Status: DC
  Administered 2015-10-11: 3 mL via RESPIRATORY_TRACT
  Filled 2015-10-11 (×2): qty 3

## 2015-10-11 MED ORDER — DILTIAZEM HCL ER COATED BEADS 180 MG PO CP24
180.0000 mg | ORAL_CAPSULE | Freq: Every day | ORAL | Status: DC
  Administered 2015-10-12: 180 mg via ORAL
  Filled 2015-10-11: qty 1

## 2015-10-11 MED ORDER — MAGNESIUM SULFATE 2 GM/50ML IV SOLN
2.0000 g | Freq: Once | INTRAVENOUS | Status: AC
  Administered 2015-10-11: 2 g via INTRAVENOUS
  Filled 2015-10-11: qty 50

## 2015-10-11 NOTE — Progress Notes (Signed)
  Subjective: No complaints. No n/v. Nurse reports HR spikes from 140-160s. No sob.   Objective: Vital signs in last 24 hours: Temp:  [97.8 F (36.6 C)-98.3 F (36.8 C)] 97.8 F (36.6 C) (07/07 2145) Pulse Rate:  [91-108] 96 (07/07 2145) Resp:  [20-24] 20 (07/07 1538) BP: (121-139)/(57-69) 126/57 mmHg (07/07 2145) SpO2:  [89 %-98 %] 92 % (07/07 2145) Last BM Date:  (Unknown)  Intake/Output from previous day: 07/07 0701 - 07/08 0700 In: 886.3 [P.O.:480; I.V.:406.3] Out: 325 [Urine:325] Intake/Output this shift:    Awake, alert, eating breakfast. Calm cta with mild b/l exp wheeze Tachy Soft, nt, nd No edema  Lab Results:   Recent Labs  10/08/15 0907  WBC 6.1  HGB 10.0*  HCT 30.0*  PLT 252   BMET  Recent Labs  10/10/15 0454 10/11/15 0538  NA 138 137  K 3.4* 3.7  CL 108 105  CO2 23 25  GLUCOSE 105* 100*  BUN <5* <5*  CREATININE 0.61 0.55  CALCIUM 8.3* 8.4*   PT/INR No results for input(s): LABPROT, INR in the last 72 hours. ABG No results for input(s): PHART, HCO3 in the last 72 hours.  Invalid input(s): PCO2, PO2  Studies/Results: No results found.  Anti-infectives: Anti-infectives    None      Assessment/Plan: Fall L rib FX 2-8 with PTX - Pulmonary toilet ABL anemia - Improved ETOH abuse - CIWA, NH3 25 Tachycardia/suspect PAF - Appreciate cardiology consult, no anticoagulation at this time. Cards increased lopressor and started cardizem yesterday. Still with periods of tachycardia. Will need to touch base with cards regarding ongoing tachycardia Skin mass R shoulder - may F/U with Dr. Grandville Silos at [place] [address] - Increase bowel regimen, add K+ VTE - SCD's, Lovenox Dispo - home when cleared by cards  Leighton Ruff. Redmond Pulling, MD, FACS General, Bariatric, & Minimally Invasive Surgery [facility], [place]    LOS: 7 days    Gayland Curry 10/11/2015

## 2015-10-11 NOTE — Progress Notes (Signed)
Physical Therapy Treatment Patient Details Name: Lisa Chan MRN: [id number] DOB: [birth year] Today's Date: 10/11/2015    History of Present Illness 82 y.o. female admitted to [facility] on 10/04/15 s/p fall with rib fxs and pneumopthorax.  L chest tube placed 6/30, and revised 10/04/15.  Pt with significant PMhx of arthritis.      PT Comments    Pt mobilizing better with therapy today, stood with min A, transferred to chair and ambulated 3' with RW with mod A. Family is planning to take pt home, rec HHPT. PT will continue to follow.   Follow Up Recommendations  Home health PT;Supervision/Assistance - 24 hour     Equipment Recommendations  Rolling walker with 5" wheels    Recommendations for Other Services       Precautions / Restrictions Precautions Precautions: Fall Precaution Comments: pt reports significant PMHx of multiple falls, also verbalized fear of falling. Restrictions Weight Bearing Restrictions: No    Mobility  Bed Mobility Overal bed mobility: Needs Assistance Bed Mobility: Supine to Sit     Supine to sit: Min assist     General bed mobility comments: min A to roll left and slide legs off bed and min A to elevate trunk. Pt needed verbal and tactile cues for sequencing  Transfers Overall transfer level: Needs assistance Equipment used: Rolling walker (2 wheeled);None Transfers: Sit to/from [employer] to Stand: Min assist Stand pivot transfers: Mod assist       General transfer comment: pt stood from be to therapist with min A as well as recliner to RW with min A. Required mod A with SPT due to very small steps, decreased movement of LLE and fear of falling  Ambulation/Gait Ambulation/Gait assistance: Mod assist Ambulation Distance (Feet): 3 Feet Assistive device: Rolling walker (2 wheeled) Gait Pattern/deviations: Step-to pattern;Trunk flexed Gait velocity: very slow Gait velocity interpretation: <1.8 ft/sec, indicative of risk for  recurrent falls General Gait Details: able to ambulate fwd 3' with RW with min A but loses balance when moving bkwd and requires mod A to prevent fall. Worked on picking up feet, especially left   Stairs            Wheelchair Mobility    Modified Rankin (Stroke Patients Only)       Balance Overall balance assessment: Needs assistance;History of Falls Sitting-balance support: Bilateral upper extremity supported Sitting balance-Leahy Scale: Poor Sitting balance - Comments: can maintain balance with both hands holding EOB but loses balance when unsupported Postural control: Posterior lean Standing balance support: Bilateral upper extremity supported Standing balance-Leahy Scale: Poor Standing balance comment: heavy reliance on UE support                    Cognition Arousal/Alertness: Awake/alert Behavior During Therapy: WFL for tasks assessed/performed Overall Cognitive Status: History of cognitive impairments - at baseline Area of Impairment: Memory;Problem solving     Memory: Decreased short-term memory Following Commands: Follows one step commands consistently     Problem Solving: Slow processing;Decreased initiation;Difficulty sequencing;Requires verbal cues;Requires tactile cues General Comments: pt oriented to self and place today and was appropriate with conversation though repeated self frequently. Decreased verbalization with exertion    Exercises      General Comments General comments (skin integrity, edema, etc.): HR at rest in 110'2 and low 120's, up to 150 bpm with ambulation, RN aware and getting meds to address      Pertinent Vitals/Pain Pain Assessment: No/denies pain  HR up to  150 bpm with ambulation    Home Living                      Prior Function            PT Goals (current goals can now be found in the care plan section) Acute Rehab PT Goals Patient Stated Goal: to go home PT Goal Formulation: With patient Time For  Goal Achievement: 10/18/15 Potential to Achieve Goals: Good Progress towards PT goals: Progressing toward goals    Frequency  Min 3X/week    PT Plan Discharge plan needs to be updated    Co-evaluation             End of Session Equipment Utilized During Treatment: Gait belt Activity Tolerance: Patient tolerated treatment well Patient left: in chair;with chair alarm set;with call bell/phone within reach     Time: [id number] PT Time Calculation (min) (ACUTE ONLY): 26 min  Charges:  $Gait Training: 8-22 mins $Therapeutic Activity: 8-22 mins                    G Codes:     Leighton Roach, PT  Acute Rehab Services  [place], [place] 10/11/2015, 10:18 AM

## 2015-10-11 NOTE — Progress Notes (Signed)
Occupational Therapy Treatment Patient Details Name: Lisa Chan MRN: [id number] DOB: Mar 17, 1927 Today's Date: 10/11/2015    History of present illness 82 y.o. female admitted to [facility] on 10/04/15 s/p fall with rib fxs and pneumopthorax.  L chest tube placed 6/30, and revised 10/04/15.  Pt with significant PMhx of arthritis.     OT comments  Pt progressing well towards OT goals and mobilized with less assistance today. Overall, pt required mod assist for LB ADLs and basic transfers with cues for safety and proper RW use. Pt's daughter was not present for session - will need to be sure that she can provide necessary level of physical assistance in safe manner for both pt and herself. Will plan to see pt tomorrow for session with daughter if possible.   Follow Up Recommendations  Home health OT;Supervision/Assistance - 24 hour    Equipment Recommendations  3 in 1 bedside comode;Wheelchair (measurements OT);Hospital bed    Recommendations for Other Services      Precautions / Restrictions Precautions Precautions: Fall Precaution Comments: pt reports significant PMHx of multiple falls, also verbalized fear of falling. Restrictions Weight Bearing Restrictions: No       Mobility Bed Mobility               General bed mobility comments: Pt up in chair on OT arrival  Transfers Overall transfer level: Needs assistance Equipment used: Rolling walker (2 wheeled) Transfers: Sit to/from [facility] Sit to Stand: Min assist Stand pivot transfers: Mod assist       General transfer comment: Assist for balance upon standing and for safe RW use. Increased assistance for pivotal steps to [facility]. Pt takes very small steps and has difficulty managing RW.    Balance Overall balance assessment: Needs assistance Sitting-balance support: No upper extremity supported;Feet supported Sitting balance-Leahy Scale: Fair Sitting balance - Comments: able to lift LE onto recliner to  reach LB with min guard assist   Standing balance support: Bilateral upper extremity supported;During functional activity Standing balance-Leahy Scale: Poor Standing balance comment: reliant on UE support for balance                   ADL Overall ADL's : Needs assistance/impaired     Grooming: Set up;Sitting   Upper Body Bathing: Minimal assitance;Sitting   Lower Body Bathing: Minimal assistance;Sit to/from stand   Upper Body Dressing : Minimal assistance;Sitting   Lower Body Dressing: Minimal assistance;Sit to/from stand   Toilet Transfer: Moderate assistance;Stand-pivot;Cueing for safety;BSC;RW   Toileting- Clothing Manipulation and Hygiene: Moderate assistance;Sit to/from stand       Functional mobility during ADLs: Moderate assistance;Rolling walker General ADL Comments: Daughter no present for OT session. Pt's mobilizing with less assistance this session, overall min-mod assist.      Vision                     Perception     Praxis      Cognition   Behavior During Therapy: [facility] for tasks assessed/performed Overall Cognitive Status: History of cognitive impairments - at baseline (slow to respond but generally seem accurate) Area of Impairment: Memory;Problem solving;Safety/judgement Orientation Level: Disoriented to;Time        Safety/Judgement: Decreased awareness of safety;Decreased awareness of deficits   Problem Solving: Slow processing;Requires verbal cues General Comments: Pt oriented x3 today. Appropriate with conversation, but still demonstrating decreased safety awareness and problem solving skills during ADL tasks and requires VCs    Extremity/Trunk Assessment  Exercises     Shoulder Instructions       General Comments      Pertinent Vitals/ Pain       Pain Assessment: No/denies pain  Home Living                                          Prior Functioning/Environment               Frequency Min 3X/week     Progress Toward Goals  OT Goals(current goals can now be found in the care plan section)  Progress towards OT goals: Progressing toward goals  Acute Rehab OT Goals Patient Stated Goal: to go home OT Goal Formulation: With patient Time For Goal Achievement: 10/20/15 Potential to Achieve Goals: Good ADL Goals Pt Will Perform Grooming: with min guard assist;standing Pt Will Perform Lower Body Bathing: with min assist;sit to/from stand Pt Will Perform Lower Body Dressing: with min assist;sit to/from stand Pt Will Transfer to Toilet: with min assist;ambulating;bedside commode  Plan Frequency needs to be updated    Co-evaluation                 End of Session Equipment Utilized During Treatment: Gait belt;Rolling walker   Activity Tolerance Patient tolerated treatment well   Patient Left in chair;with call bell/phone within reach;with chair alarm set   Nurse Communication Mobility status;Other (comment) (Pt had bowel movement)        Time: [id number] OT Time Calculation (min): 30 min  Charges: OT Treatments $Self Care/Home Management : 23-37 mins  Redmond Baseman, OTR/L Pager: [phone] 10/11/2015, 4:50 PM

## 2015-10-11 NOTE — Progress Notes (Signed)
Patient: Lisa Chan / Admit Date: 10/04/2015 / Date of Encounter: 10/11/2015, 11:03 AM   Subjective: No complaints. Says PT session went on without any CP, SOB or palpitations. HR spikes 140-160s earlier this AM - this was reported during movement with PT as well. HR currently 80s while resting in the chair.   Objective: Telemetry: NSR with PACs - periods of elevated HR appear to be mostly sinus tach with PACs versus MAT, possible brief AF at higher rates but there is an obvious gradual change in HR rather than abrupt Physical Exam: Blood pressure 141/51, pulse 115, temperature 98.3 F (36.8 C), temperature source Oral, resp. rate 20, height 5\' 1"  (1.549 m), weight 99 lb 3.3 oz (45 kg), SpO2 93 %. General: Frail thin elderly WF in no acute distress. Head: Normocephalic, atraumatic, sclera non-icteric, no xanthomas, nares are without discharge. Neck: JVP not elevated. Lungs: Coarse but clear bilaterally to auscultation without wheezes, rales, or rhonchi. Breathing is unlabored. Heart: RRR S1 S2 occ ectopy without murmurs, rubs, or gallops.  Abdomen: Soft, non-tender, non-distended with normoactive bowel sounds. No rebound/guarding. Extremities: No clubbing or cyanosis. No edema. Distal pedal pulses are 2+ and equal bilaterally. Neuro: Alert and oriented X 3. Moves all extremities spontaneously. Psych:  Responds to questions appropriately with a normal affect.   Intake/Output Summary (Last 24 hours) at 10/11/15 1103 Last data filed at 10/10/15 1221  Gross per 24 hour  Intake  437.5 ml  Output    100 ml  Net  337.5 ml    Inpatient Medications:  . diltiazem  120 mg Oral Daily  . docusate sodium  200 mg Oral BID  . enoxaparin (LOVENOX) injection  40 mg Subcutaneous Q24H  . folic acid  1 mg Oral Daily  . ipratropium-albuterol  3 mL Nebulization BID  . metoprolol tartrate  100 mg Oral BID  . multivitamin with minerals  1 tablet Oral Daily  . polyethylene glycol  17 g Oral Daily  .  potassium chloride  20 mEq Oral BID  . thiamine  100 mg Oral Daily   Infusions:    Labs:  Recent Labs  10/10/15 0454 10/11/15 0538  NA 138 137  K 3.4* 3.7  CL 108 105  CO2 23 25  GLUCOSE 105* 100*  BUN <5* <5*  CREATININE 0.61 0.55  CALCIUM 8.3* 8.4*  MG 1.4*  --    No results for input(s): AST, ALT, ALKPHOS, BILITOT, PROT, ALBUMIN in the last 72 hours. No results for input(s): WBC, NEUTROABS, HGB, HCT, MCV, PLT in the last 72 hours. No results for input(s): CKTOTAL, CKMB, TROPONINI in the last 72 hours. Invalid input(s): POCBNP No results for input(s): HGBA1C in the last 72 hours.   Radiology/Studies:  Dg Chest 2 View  10/03/2015  CLINICAL DATA:  Unwitnessed fall. EXAM: CHEST  2 VIEW COMPARISON:  None. FINDINGS: There are numerous left rib fractures, involving at least the fourth through ninth ribs posteriorly. Most of the fractures are displaced and overriding. There is a left pneumothorax, approximately 20% by volume. There is extensive left hemi thorax subcutaneous emphysema. The right lung is well expanded and clear. Pulmonary vasculature is normal. Mediastinal contours are normal. There are remote healed fracture deformities of the clavicles and humeri bilaterally. IMPRESSION: 1. Left pneumothorax, approximately 20% by volume. 2. Extensive left subcutaneous emphysema. 3. Numerous left rib fractures, displaced and overriding. These results were called by telephone at the time of interpretation on 10/03/2015 at 10:31 pm to Dr. Carter Kitten  GOODMAN , who verbally acknowledged these results. Electronically Signed   By: Andreas Newport M.D.   On: 10/03/2015 22:33   Ct Head Wo Contrast  10/03/2015  CLINICAL DATA:  82 year old female with fall. EXAM: CT HEAD WITHOUT CONTRAST TECHNIQUE: Contiguous axial images were obtained from the base of the skull through the vertex without intravenous contrast. COMPARISON:  CT dated 02/20/2013 FINDINGS: There is moderate age-related atrophy and chronic  microvascular ischemic changes. There is no acute intracranial hemorrhage. No mass effect or midline shift noted. The visualized paranasal sinuses and mastoid air cells are clear. The calvarium is intact. IMPRESSION: No acute intracranial hemorrhage. Age-related atrophy and chronic microvascular ischemic disease. Electronically Signed   By: Anner Crete M.D.   On: 10/03/2015 22:22   Ct Chest W Contrast  10/03/2015  CLINICAL DATA:  Unwitnessed fall. EXAM: CT CHEST, ABDOMEN, AND PELVIS WITH CONTRAST TECHNIQUE: Multidetector CT imaging of the chest, abdomen and pelvis was performed following the standard protocol during bolus administration of intravenous contrast. CONTRAST:  75 mL Isovue 370 intravenous COMPARISON:  Radiographs 10/03/2015 FINDINGS: CT CHEST There are displaced fractures of the left second through eighth ribs. There is a moderate left pneumothorax, 15-20% by volume. There is left subcutaneous emphysema. There is a small left pleural effusion. There is mild consolidation or contusion of the posterior left lower lobe. The right lung is expanded and clear. The airways are patent. There is no intrathoracic vascular injury. Vertebral column and sternum are intact. There is mild compression of T9 which appears to be chronic. CT ABDOMEN AND PELVIS There are intact appearances of the liver, spleen, pancreas, adrenals and kidneys. There is mild diffuse fatty infiltration of the liver. Bowel is remarkable only for extensive colonic diverticulosis. There is no peritoneal blood or free air. There is no intra-abdominal vascular injury. There is moderate compression of L1 which is of indeterminate age but more likely remote. Mild remote compression of L5. No evidence of acute fracture involving the pelvis or hips. Remote healed left pubic ramus fracture deformities are present. IMPRESSION: 1. Acute displaced fractures of the left second through eighth ribs. Moderate left pneumothorax, 15-20% by volume. 2.  Small left pleural effusion and left lower lobe atelectasis or contusion. 3. No acute traumatic injury in the abdomen or pelvis. 4. Moderate compression of L1, indeterminate age but more likely remote. 5. Chronic appearing compressions of T9 and L5. Remote healed left pubic ramus fracture deformities. Remote posttraumatic deformities of the proximal humerus bilaterally, incompletely united on the left. Electronically Signed   By: Andreas Newport M.D.   On: 10/03/2015 23:26   Ct Abdomen Pelvis W Contrast  10/03/2015  CLINICAL DATA:  Unwitnessed fall. EXAM: CT CHEST, ABDOMEN, AND PELVIS WITH CONTRAST TECHNIQUE: Multidetector CT imaging of the chest, abdomen and pelvis was performed following the standard protocol during bolus administration of intravenous contrast. CONTRAST:  75 mL Isovue 370 intravenous COMPARISON:  Radiographs 10/03/2015 FINDINGS: CT CHEST There are displaced fractures of the left second through eighth ribs. There is a moderate left pneumothorax, 15-20% by volume. There is left subcutaneous emphysema. There is a small left pleural effusion. There is mild consolidation or contusion of the posterior left lower lobe. The right lung is expanded and clear. The airways are patent. There is no intrathoracic vascular injury. Vertebral column and sternum are intact. There is mild compression of T9 which appears to be chronic. CT ABDOMEN AND PELVIS There are intact appearances of the liver, spleen, pancreas, adrenals and kidneys. There  is mild diffuse fatty infiltration of the liver. Bowel is remarkable only for extensive colonic diverticulosis. There is no peritoneal blood or free air. There is no intra-abdominal vascular injury. There is moderate compression of L1 which is of indeterminate age but more likely remote. Mild remote compression of L5. No evidence of acute fracture involving the pelvis or hips. Remote healed left pubic ramus fracture deformities are present. IMPRESSION: 1. Acute displaced  fractures of the left second through eighth ribs. Moderate left pneumothorax, 15-20% by volume. 2. Small left pleural effusion and left lower lobe atelectasis or contusion. 3. No acute traumatic injury in the abdomen or pelvis. 4. Moderate compression of L1, indeterminate age but more likely remote. 5. Chronic appearing compressions of T9 and L5. Remote healed left pubic ramus fracture deformities. Remote posttraumatic deformities of the proximal humerus bilaterally, incompletely united on the left. Electronically Signed   By: Andreas Newport M.D.   On: 10/03/2015 23:26   Dg Chest Port 1 View  10/09/2015  CLINICAL DATA:  Dramatic hemo pneumothorax. Left-sided rib fractures. EXAM: PORTABLE CHEST 1 VIEW COMPARISON:  10/08/2015, 10/03/2015. FINDINGS: Interim removal of left chest tube. Tiny left apical pneumothorax is again noted and stable. Heart size stable. Low lung volumes with bibasilar atelectasis and/or infiltrates. Small left pleural effusion. Multiple displaced left rib fractures again noted. Humeral fractures and clavicular fractures again noted, these may be old. Stable mild left chest wall subcutaneous emphysema. IMPRESSION: 1. Interim removal of left chest tube. Stable tiny left apical pneumothorax. Stable mild left chest wall subcutaneous emphysema . 2. Low lung volumes with bibasilar atelectasis and/or infiltrates. Small left pleural effusion. 3. Multiple displaced left rib fractures again noted. Humeral fracture is and clavicular fractures again noted, these may be old. Electronically Signed   By: Marcello Moores  Register   On: 10/09/2015 07:12   Dg Chest Port 1 View  10/08/2015  CLINICAL DATA:  Traumatic left pneumothorax due to fall and subsequent rib fractures. EXAM: PORTABLE CHEST 1 VIEW COMPARISON:  Portable chest x-ray of October 07, 2015 FINDINGS: A approximately 10-15% left apical pneumothorax is visible and relatively stable. There is pleural fluid at the left lung base. The retrocardiac region  remains dense. The left-sided chest tube is in stable position. Multiple posterior and lateral rib fractures are again demonstrated. There is no mediastinal shift. The left lung is well-expanded and clear. The heart is normal in size. There is no pulmonary vascular congestion. There is calcification in the wall of the aortic arch. Chronic deformity of the humeral heads and necks is present bilaterally. There is deformity of the distal clavicles bilaterally also stable. IMPRESSION: 1. 10-15% left apical pneumothorax. The left chest tube is in stable position. Small left pleural effusion. The right lung is clear. 2. Multiple left-sided rib fractures. Previous fractures of the proximal humeri and of the clavicles. 3. Aortic atherosclerosis. Electronically Signed   By: David  Martinique M.D.   On: 10/08/2015 08:28   Dg Chest Port 1 View  10/07/2015  CLINICAL DATA:  Chest trauma. Left pneumothorax and rib fractures. Follow-up exam. EXAM: PORTABLE CHEST 1 VIEW COMPARISON:  10/06/2015 FINDINGS: Tiny left apical pneumothorax seen on the previous day's study is now smaller, minimally perceptible. Left chest tube is stable. Multiple displaced rib fractures on the left are also unchanged. Opacity at the left lung base is likely combination of pleural fluid and atelectasis. This is similar to the previous day's study. Right lung remains clear. Left lateral chest wall subcutaneous emphysema is unchanged.  IMPRESSION: 1. Left pneumothorax has decreased now almost imperceptible. 2. No other change. Mild persistent left base opacity consistent combination pleural fluid and atelectasis. Stable left chest tube. Electronically Signed   By: Lajean Manes M.D.   On: 10/07/2015 08:34   Dg Chest Port 1 View  10/06/2015  CLINICAL DATA:  Pneumothorax. EXAM: PORTABLE CHEST 1 VIEW COMPARISON:  10/05/2015.  10/04/2015 . FINDINGS: Left chest tube in stable position. Mediastinum and hilar structures are normal. Heart size stable. Left base  subsegmental atelectasis. Mild left pleural thickening. Tiny left apical pneumothorax again cannot be excluded on today's exam. Multiple displaced left rib fractures again noted. Right clavicular fracture, possibly old. Thoracic spine scoliosis. Left chest wall subcutaneous emphysema. IMPRESSION: 1. Left chest tube in stable position. Tiny left apical pneumothorax cannot be excluded on today's exam. Left chest wall subcutaneous emphysema stable. Multiple displaced left rib fractures again noted. Right clavicular fracture, possibly old. 2.  Mild left base atelectasis. Critical Value/emergent results were called by telephone at the time of interpretation on 10/06/2015 at 7:36 am to nurse Judeen Hammans, who verbally acknowledged these results. Electronically Signed   By: Marcello Moores  Register   On: 10/06/2015 07:40   Dg Chest Port 1 View  10/05/2015  CLINICAL DATA:  Traumatic hemopneumothorax EXAM: PORTABLE CHEST 1 VIEW COMPARISON:  10/04/2015 FINDINGS: Cardiac shadow is stable. Aortic calcifications are again noted. A left chest tube is seen. Multiple left rib fractures are again noted. Subcutaneous emphysema is seen. No definitive pneumothorax is noted. The right lung is clear. Small left pleural effusion is noted. IMPRESSION: No pneumothorax is noted.  The remainder of the exam is stable. Electronically Signed   By: Inez Catalina M.D.   On: 10/05/2015 08:34   Dg Chest Port 1 View  10/04/2015  CLINICAL DATA:  Left pneumothorax EXAM: PORTABLE CHEST 1 VIEW COMPARISON:  10/04/2015 at 0 00:06 FINDINGS: A left chest tube has been placed with significant reduction of the left pneumothorax. There probably is a small residual left apical pleural air collection, poorly defined due to the overlying subcutaneous emphysema. Numerous left rib fractures are again evident. Right lung remains expanded and clear. Small left effusion. IMPRESSION: Reduced left pneumothorax with placement of a chest tube. There probably is a small apical  pneumothorax remaining. Extensive subcutaneous emphysema limits the examination. Multiple left rib fractures. Electronically Signed   By: Andreas Newport M.D.   On: 10/04/2015 02:45   Dg Chest Portable 1 View  10/04/2015  CLINICAL DATA:  82 year old female with left-sided chest tube placement EXAM: PORTABLE CHEST 1 VIEW COMPARISON:  Chest radiograph and CT dated 10/03/2015 FINDINGS: There has been interval placement of a left-sided chest tube. The tip of chest tube appears at the left lateral bony thorax are outside to the ribs. Recommend re-evaluation and repositioning. There is a small left apical pneumothorax measuring approximately 2 cm to the apical pleural. Left lung base opacity compatible with combination of small pleural effusion and associated atelectasis/ contusion of the adjacent lung. The right lung is clear. Multiple left rib fractures noted. There is subcutaneous soft tissue edema of the left chest wall. Bilateral humeral neck old fractures with nonunion on the left. IMPRESSION: Left-sided chest tube terminates outside to the lateral bony thorax. Recommend re-evaluation and repositioning. Small left apical pneumothorax. Electronically Signed   By: Anner Crete M.D.   On: 10/04/2015 00:44     Assessment and Plan  62F with ongoing alcohol abuse, arthritis admitted with mechanical fall c/b multiple rib fractures,  pneumothorax, ABL anemia. Cardiology following for episodic tachycardia/possible PAF.  1. Tachycardia - her rhythm primarily appears to be sinus rhythm with gradual trend towards sinus tach with PACs versus MAT. There are not really any abupt rate shifts. Dr. Marlou Porch raised question of afib yesterday. I will review today's telemetry with MD. I suspect elevated HRs due to deconditioning, recent anemia, and possible component of withdrawal. Regardless even if felt to represent AF, she has not been felt to be a good candidate for anticoagulation given instability, fall risk, and  ongoing alcohol abuse in the presence of recent ABL anemia. Her HR is presently 80's - NSR with PACs - continue current regimen. I will add a CBC and TSH for today's labs to exclude downtrend of her Hgb since 7/5. It appears surgery will be sending her home soon - we can consider outpatient echo if this is the case.  2. Alcohol abuse - cessation advised.  3. Hypomagnesemia - Mg 1.4 yesterday. Will give 2g mag sulfate and f/u in AM.   Signed, Melina Copa PA-C Pager: [phone]

## 2015-10-12 LAB — CBC
HCT: 27 % — ABNORMAL LOW (ref 36.0–46.0)
Hemoglobin: 8.7 g/dL — ABNORMAL LOW (ref 12.0–15.0)
MCH: 33.7 pg (ref 26.0–34.0)
MCHC: 32.2 g/dL (ref 30.0–36.0)
MCV: 104.7 fL — AB (ref 78.0–100.0)
PLATELETS: 350 10*3/uL (ref 150–400)
RBC: 2.58 MIL/uL — AB (ref 3.87–5.11)
RDW: 13.7 % (ref 11.5–15.5)
WBC: 4.3 10*3/uL (ref 4.0–10.5)

## 2015-10-12 LAB — BASIC METABOLIC PANEL
Anion gap: 6 (ref 5–15)
BUN: <5 mg/dL — ABNORMAL LOW (ref 6–20)
CALCIUM: 8.4 mg/dL — AB (ref 8.9–10.3)
CHLORIDE: 103 mmol/L (ref 101–111)
CO2: 26 mmol/L (ref 22–32)
CREATININE: 0.63 mg/dL (ref 0.44–1.00)
GFR calc non Af Amer: >60 mL/min (ref >60)
Glucose, Bld: 92 mg/dL (ref 65–99)
Potassium: 3.8 mmol/L (ref 3.5–5.1)
SODIUM: 135 mmol/L (ref 135–145)

## 2015-10-12 LAB — MAGNESIUM: Magnesium: 1.7 mg/dL (ref 1.7–2.4)

## 2015-10-12 MED ORDER — TRAMADOL HCL 50 MG PO TABS: 50.0000 mg | ORAL_TABLET | Freq: Four times a day (QID) | ORAL | Status: DC | PRN

## 2015-10-12 MED ORDER — ADULT MULTIVITAMIN W/MINERALS CH: ORAL_TABLET | ORAL | Status: DC

## 2015-10-12 MED ORDER — DOCUSATE SODIUM 100 MG PO CAPS: ORAL_CAPSULE | ORAL | Status: DC

## 2015-10-12 MED ORDER — DILTIAZEM HCL ER COATED BEADS 180 MG PO CP24: 180.0000 mg | ORAL_CAPSULE | Freq: Every day | ORAL | Status: DC

## 2015-10-12 MED ORDER — IPRATROPIUM-ALBUTEROL 0.5-2.5 (3) MG/3ML IN SOLN: 3.0000 mL | RESPIRATORY_TRACT | Status: DC | PRN

## 2015-10-12 MED ORDER — METOPROLOL TARTRATE 100 MG PO TABS: 100.0000 mg | ORAL_TABLET | Freq: Two times a day (BID) | ORAL | Status: DC

## 2015-10-12 MED ORDER — ACETAMINOPHEN 325 MG PO TABS: 650.0000 mg | ORAL_TABLET | Freq: Four times a day (QID) | ORAL | Status: DC | PRN

## 2015-10-12 NOTE — Progress Notes (Signed)
OT Cancellation Note  Patient Details Name: KAITLYNNE ZYSKOWSKI MRN: [id number] DOB: [birth year]   Cancelled Treatment:    Reason Eval/Treat Not Completed: Other (comment). Daughter present on OT arrival and reported she had taken pt to the bathroom several times and assisted with dressing. She reported feeling confidence with assisting pt with transfers and all ADLs and declined need to practice with therapist present. Educated daughter on use of gait belt (or regular belt) to assist with more difficult transfers to avoid pulling up on pt's sore shoulder.   Redmond Baseman, OTR/L Pager: [phone] 10/12/2015, 2:18 PM

## 2015-10-12 NOTE — Progress Notes (Signed)
Patient: Lisa Chan / Admit Date: 10/04/2015 / Date of Encounter: 10/12/2015, 11:21 AM   Subjective: Denies any complaints. Daughter plans to take her home - describes her mother's habits as stubborn - has refused to see any doctor over the decades, doesn't like taking medications, has been somewhat agitated to be here in the hospital ("the worst week of my life"), refuses to use walker because of the stigma. Daughter feels she will do better in her home environment.   Objective: Telemetry: NSR with PACs, still periods of elevated HR appear to be mostly sinus tach with PACs versus MAT. Occasional PACs overnight including one blocked PAC give the appearance of occasional pauses when this is just a compensatory pause <1.6sec Physical Exam: Blood pressure 128/60, pulse 82, temperature 97.3 F (36.3 C), temperature source Oral, resp. rate 17, height 5\' 1"  (1.549 m), weight 99 lb 3.3 oz (45 kg), SpO2 93 %. General: Frail thin elderly WF in no acute distress. Head: Normocephalic, atraumatic, sclera non-icteric, no xanthomas, nares are without discharge. Neck: JVP not elevated. Lungs: Coarse but clear bilaterally to auscultation without wheezes, rales, or rhonchi. Breathing is unlabored. Heart: RRR S1 S2 occ ectopy without murmurs, rubs, or gallops.  Abdomen: Soft, non-tender, non-distended with normoactive bowel sounds. No rebound/guarding. Extremities: No clubbing or cyanosis. No edema. Distal pedal pulses are 2+ and equal bilaterally. Skin mass right shoulder Neuro: Alert and oriented X 3. Moves all extremities spontaneously. Psych: Responds to questions appropriately with a normal affect.   Intake/Output Summary (Last 24 hours) at 10/12/15 1121 Last data filed at 10/12/15 0154  Gross per 24 hour  Intake     50 ml  Output    150 ml  Net   -100 ml    Inpatient Medications:  . diltiazem  180 mg Oral Daily  . docusate sodium  200 mg Oral BID  . enoxaparin (LOVENOX) injection  40 mg  Subcutaneous Q24H  . folic acid  1 mg Oral Daily  . metoprolol tartrate  100 mg Oral BID  . multivitamin with minerals  1 tablet Oral Daily  . polyethylene glycol  17 g Oral Daily  . potassium chloride  20 mEq Oral BID  . thiamine  100 mg Oral Daily   Infusions:    Labs:  Recent Labs  10/10/15 0454 10/11/15 0538 10/12/15 0430  NA 138 137 135  K 3.4* 3.7 3.8  CL 108 105 103  CO2 23 25 26   GLUCOSE 105* 100* 92  BUN <5* <5* <5*  CREATININE 0.61 0.55 0.63  CALCIUM 8.3* 8.4* 8.4*  MG 1.4*  --  1.7   No results for input(s): AST, ALT, ALKPHOS, BILITOT, PROT, ALBUMIN in the last 72 hours.  Recent Labs  10/11/15 1201 10/12/15 0430  WBC 5.2 4.3  HGB 10.3* 8.7*  HCT 31.5* 27.0*  MCV 103.6* 104.7*  PLT 418* 350   No results for input(s): CKTOTAL, CKMB, TROPONINI in the last 72 hours. Invalid input(s): POCBNP No results for input(s): HGBA1C in the last 72 hours.   Radiology/Studies:  Dg Chest 2 View  10/03/2015  CLINICAL DATA:  Unwitnessed fall. EXAM: CHEST  2 VIEW COMPARISON:  None. FINDINGS: There are numerous left rib fractures, involving at least the fourth through ninth ribs posteriorly. Most of the fractures are displaced and overriding. There is a left pneumothorax, approximately 20% by volume. There is extensive left hemi thorax subcutaneous emphysema. The right lung is well expanded and clear. Pulmonary vasculature is normal. Mediastinal  contours are normal. There are remote healed fracture deformities of the clavicles and humeri bilaterally. IMPRESSION: 1. Left pneumothorax, approximately 20% by volume. 2. Extensive left subcutaneous emphysema. 3. Numerous left rib fractures, displaced and overriding. These results were called by telephone at the time of interpretation on 10/03/2015 at 10:31 pm to Dr. Nance Pear , who verbally acknowledged these results. Electronically Signed   By: Andreas Newport M.D.   On: 10/03/2015 22:33   Ct Head Wo Contrast  10/03/2015   CLINICAL DATA:  82 year old female with fall. EXAM: CT HEAD WITHOUT CONTRAST TECHNIQUE: Contiguous axial images were obtained from the base of the skull through the vertex without intravenous contrast. COMPARISON:  CT dated 02/20/2013 FINDINGS: There is moderate age-related atrophy and chronic microvascular ischemic changes. There is no acute intracranial hemorrhage. No mass effect or midline shift noted. The visualized paranasal sinuses and mastoid air cells are clear. The calvarium is intact. IMPRESSION: No acute intracranial hemorrhage. Age-related atrophy and chronic microvascular ischemic disease. Electronically Signed   By: Anner Crete M.D.   On: 10/03/2015 22:22   Ct Chest W Contrast  10/03/2015  CLINICAL DATA:  Unwitnessed fall. EXAM: CT CHEST, ABDOMEN, AND PELVIS WITH CONTRAST TECHNIQUE: Multidetector CT imaging of the chest, abdomen and pelvis was performed following the standard protocol during bolus administration of intravenous contrast. CONTRAST:  75 mL Isovue 370 intravenous COMPARISON:  Radiographs 10/03/2015 FINDINGS: CT CHEST There are displaced fractures of the left second through eighth ribs. There is a moderate left pneumothorax, 15-20% by volume. There is left subcutaneous emphysema. There is a small left pleural effusion. There is mild consolidation or contusion of the posterior left lower lobe. The right lung is expanded and clear. The airways are patent. There is no intrathoracic vascular injury. Vertebral column and sternum are intact. There is mild compression of T9 which appears to be chronic. CT ABDOMEN AND PELVIS There are intact appearances of the liver, spleen, pancreas, adrenals and kidneys. There is mild diffuse fatty infiltration of the liver. Bowel is remarkable only for extensive colonic diverticulosis. There is no peritoneal blood or free air. There is no intra-abdominal vascular injury. There is moderate compression of L1 which is of indeterminate age but more likely  remote. Mild remote compression of L5. No evidence of acute fracture involving the pelvis or hips. Remote healed left pubic ramus fracture deformities are present. IMPRESSION: 1. Acute displaced fractures of the left second through eighth ribs. Moderate left pneumothorax, 15-20% by volume. 2. Small left pleural effusion and left lower lobe atelectasis or contusion. 3. No acute traumatic injury in the abdomen or pelvis. 4. Moderate compression of L1, indeterminate age but more likely remote. 5. Chronic appearing compressions of T9 and L5. Remote healed left pubic ramus fracture deformities. Remote posttraumatic deformities of the proximal humerus bilaterally, incompletely united on the left. Electronically Signed   By: Andreas Newport M.D.   On: 10/03/2015 23:26   Ct Abdomen Pelvis W Contrast  10/03/2015  CLINICAL DATA:  Unwitnessed fall. EXAM: CT CHEST, ABDOMEN, AND PELVIS WITH CONTRAST TECHNIQUE: Multidetector CT imaging of the chest, abdomen and pelvis was performed following the standard protocol during bolus administration of intravenous contrast. CONTRAST:  75 mL Isovue 370 intravenous COMPARISON:  Radiographs 10/03/2015 FINDINGS: CT CHEST There are displaced fractures of the left second through eighth ribs. There is a moderate left pneumothorax, 15-20% by volume. There is left subcutaneous emphysema. There is a small left pleural effusion. There is mild consolidation or contusion of the  posterior left lower lobe. The right lung is expanded and clear. The airways are patent. There is no intrathoracic vascular injury. Vertebral column and sternum are intact. There is mild compression of T9 which appears to be chronic. CT ABDOMEN AND PELVIS There are intact appearances of the liver, spleen, pancreas, adrenals and kidneys. There is mild diffuse fatty infiltration of the liver. Bowel is remarkable only for extensive colonic diverticulosis. There is no peritoneal blood or free air. There is no intra-abdominal  vascular injury. There is moderate compression of L1 which is of indeterminate age but more likely remote. Mild remote compression of L5. No evidence of acute fracture involving the pelvis or hips. Remote healed left pubic ramus fracture deformities are present. IMPRESSION: 1. Acute displaced fractures of the left second through eighth ribs. Moderate left pneumothorax, 15-20% by volume. 2. Small left pleural effusion and left lower lobe atelectasis or contusion. 3. No acute traumatic injury in the abdomen or pelvis. 4. Moderate compression of L1, indeterminate age but more likely remote. 5. Chronic appearing compressions of T9 and L5. Remote healed left pubic ramus fracture deformities. Remote posttraumatic deformities of the proximal humerus bilaterally, incompletely united on the left. Electronically Signed   By: Andreas Newport M.D.   On: 10/03/2015 23:26   Dg Chest Port 1 View  10/09/2015  CLINICAL DATA:  Dramatic hemo pneumothorax. Left-sided rib fractures. EXAM: PORTABLE CHEST 1 VIEW COMPARISON:  10/08/2015, 10/03/2015. FINDINGS: Interim removal of left chest tube. Tiny left apical pneumothorax is again noted and stable. Heart size stable. Low lung volumes with bibasilar atelectasis and/or infiltrates. Small left pleural effusion. Multiple displaced left rib fractures again noted. Humeral fractures and clavicular fractures again noted, these may be old. Stable mild left chest wall subcutaneous emphysema. IMPRESSION: 1. Interim removal of left chest tube. Stable tiny left apical pneumothorax. Stable mild left chest wall subcutaneous emphysema . 2. Low lung volumes with bibasilar atelectasis and/or infiltrates. Small left pleural effusion. 3. Multiple displaced left rib fractures again noted. Humeral fracture is and clavicular fractures again noted, these may be old. Electronically Signed   By: Marcello Moores  Register   On: 10/09/2015 07:12   Dg Chest Port 1 View  10/08/2015  CLINICAL DATA:  Traumatic left  pneumothorax due to fall and subsequent rib fractures. EXAM: PORTABLE CHEST 1 VIEW COMPARISON:  Portable chest x-ray of October 07, 2015 FINDINGS: A approximately 10-15% left apical pneumothorax is visible and relatively stable. There is pleural fluid at the left lung base. The retrocardiac region remains dense. The left-sided chest tube is in stable position. Multiple posterior and lateral rib fractures are again demonstrated. There is no mediastinal shift. The left lung is well-expanded and clear. The heart is normal in size. There is no pulmonary vascular congestion. There is calcification in the wall of the aortic arch. Chronic deformity of the humeral heads and necks is present bilaterally. There is deformity of the distal clavicles bilaterally also stable. IMPRESSION: 1. 10-15% left apical pneumothorax. The left chest tube is in stable position. Small left pleural effusion. The right lung is clear. 2. Multiple left-sided rib fractures. Previous fractures of the proximal humeri and of the clavicles. 3. Aortic atherosclerosis. Electronically Signed   By: David  Martinique M.D.   On: 10/08/2015 08:28   Dg Chest Port 1 View  10/07/2015  CLINICAL DATA:  Chest trauma. Left pneumothorax and rib fractures. Follow-up exam. EXAM: PORTABLE CHEST 1 VIEW COMPARISON:  10/06/2015 FINDINGS: Tiny left apical pneumothorax seen on the previous day's  study is now smaller, minimally perceptible. Left chest tube is stable. Multiple displaced rib fractures on the left are also unchanged. Opacity at the left lung base is likely combination of pleural fluid and atelectasis. This is similar to the previous day's study. Right lung remains clear. Left lateral chest wall subcutaneous emphysema is unchanged. IMPRESSION: 1. Left pneumothorax has decreased now almost imperceptible. 2. No other change. Mild persistent left base opacity consistent combination pleural fluid and atelectasis. Stable left chest tube. Electronically Signed   By: Lajean Manes M.D.   On: 10/07/2015 08:34   Dg Chest Port 1 View  10/06/2015  CLINICAL DATA:  Pneumothorax. EXAM: PORTABLE CHEST 1 VIEW COMPARISON:  10/05/2015.  10/04/2015 . FINDINGS: Left chest tube in stable position. Mediastinum and hilar structures are normal. Heart size stable. Left base subsegmental atelectasis. Mild left pleural thickening. Tiny left apical pneumothorax again cannot be excluded on today's exam. Multiple displaced left rib fractures again noted. Right clavicular fracture, possibly old. Thoracic spine scoliosis. Left chest wall subcutaneous emphysema. IMPRESSION: 1. Left chest tube in stable position. Tiny left apical pneumothorax cannot be excluded on today's exam. Left chest wall subcutaneous emphysema stable. Multiple displaced left rib fractures again noted. Right clavicular fracture, possibly old. 2.  Mild left base atelectasis. Critical Value/emergent results were called by telephone at the time of interpretation on 10/06/2015 at 7:36 am to nurse Judeen Hammans, who verbally acknowledged these results. Electronically Signed   By: Marcello Moores  Register   On: 10/06/2015 07:40   Dg Chest Port 1 View  10/05/2015  CLINICAL DATA:  Traumatic hemopneumothorax EXAM: PORTABLE CHEST 1 VIEW COMPARISON:  10/04/2015 FINDINGS: Cardiac shadow is stable. Aortic calcifications are again noted. A left chest tube is seen. Multiple left rib fractures are again noted. Subcutaneous emphysema is seen. No definitive pneumothorax is noted. The right lung is clear. Small left pleural effusion is noted. IMPRESSION: No pneumothorax is noted.  The remainder of the exam is stable. Electronically Signed   By: Inez Catalina M.D.   On: 10/05/2015 08:34   Dg Chest Port 1 View  10/04/2015  CLINICAL DATA:  Left pneumothorax EXAM: PORTABLE CHEST 1 VIEW COMPARISON:  10/04/2015 at 0 00:06 FINDINGS: A left chest tube has been placed with significant reduction of the left pneumothorax. There probably is a small residual left apical pleural air  collection, poorly defined due to the overlying subcutaneous emphysema. Numerous left rib fractures are again evident. Right lung remains expanded and clear. Small left effusion. IMPRESSION: Reduced left pneumothorax with placement of a chest tube. There probably is a small apical pneumothorax remaining. Extensive subcutaneous emphysema limits the examination. Multiple left rib fractures. Electronically Signed   By: Andreas Newport M.D.   On: 10/04/2015 02:45   Dg Chest Portable 1 View  10/04/2015  CLINICAL DATA:  82 year old female with left-sided chest tube placement EXAM: PORTABLE CHEST 1 VIEW COMPARISON:  Chest radiograph and CT dated 10/03/2015 FINDINGS: There has been interval placement of a left-sided chest tube. The tip of chest tube appears at the left lateral bony thorax are outside to the ribs. Recommend re-evaluation and repositioning. There is a small left apical pneumothorax measuring approximately 2 cm to the apical pleural. Left lung base opacity compatible with combination of small pleural effusion and associated atelectasis/ contusion of the adjacent lung. The right lung is clear. Multiple left rib fractures noted. There is subcutaneous soft tissue edema of the left chest wall. Bilateral humeral neck old fractures with nonunion on the left.  IMPRESSION: Left-sided chest tube terminates outside to the lateral bony thorax. Recommend re-evaluation and repositioning. Small left apical pneumothorax. Electronically Signed   By: Anner Crete M.D.   On: 10/04/2015 00:44     Assessment and Plan  80F with ongoing alcohol abuse, arthritis admitted with mechanical fall c/b multiple rib fractures, pneumothorax, ABL anemia. Cardiology following for episodic tachycardia/possible PAF.  1. Tachycardia - her rhythm primarily appears to be sinus rhythm with gradual trend towards sinus tach with PACs versus MAT. Higher rates seem to correlate during changes in position or activity. HR in 2014 was 93-101  so she may tend to run on higher range.HR at rest are in the 80s-90s. We suspect elevated HRs due to deconditioning, recent anemia, agitation and possible component of alcohol withdrawal. There was question of afib earlier in hospital stay but most recent tele does not show this. Regardless even if felt to represent AF, she has not been felt to be a good candidate for anticoagulation given instability, fall risk, and ongoing alcohol abuse in the presence of recent ABL anemia. TSH high but free T4 normal - this wouldn't be causing her tachycardia - she should obtain a PCP to have this rechecked in several weeks. Since she is asymptomatic I would favor continuing present regimen and not pushing up meds much further - she was naive to all BP meds prior to admission. We can see her back in the office for an acute f/u to follow up her HR and further titrate meds if needed - I have sent a message to our [facility] office's scheduler requesting a follow-up appointment, and our office will call the patient with this information.  2. Alcohol abuse - cessation advised.  3. Hypomagnesemia - s/p repletion. As she is generally resistant to medications would advise she increase magnesium in the diet. I have added this instruction to her discharge paperwork.  4. ABL anemia - per primary team.  Signed, Melina Copa PA-C Pager: [phone]

## 2015-10-12 NOTE — Progress Notes (Signed)
  Subjective: Rate up to 150 at least once this AM, PVC's, and some intermittent slower rates.  For the most part it looks fairly stable, and mostly sinus. She seems to be doing fairly well, daughter says she got her up yesterday.  Home health is set up.  Objective: Vital signs in last 24 hours: Temp:  [97.3 F (36.3 C)-98.8 F (37.1 C)] 97.3 F (36.3 C) (07/09 0544) Pulse Rate:  [82-86] 82 (07/09 0544) Resp:  [17-18] 17 (07/09 0544) BP: (128-132)/(60-66) 128/60 mmHg (07/09 0544) SpO2:  [91 %-93 %] 93 % (07/09 0544) Last BM Date:  (Unknown) Nothing PO recorded Urine 150 recorded Stool x 2 recorded Afebrile, HR up to 179 yesterday 0700 BCP ok but her Hemoglobin  is down to 8.7 from 10 last couple days, TSH is also elevated No films Intake/Output from previous day: 07/08 0701 - 07/09 0700 In: 50 [IV Piggyback:50] Out: 150 [Urine:150] Intake/Output this shift:    General appearance: alert, cooperative and no distress Resp: clear to auscultation bilaterally GI: soft, non-tender; bowel sounds normal; no masses,  no organomegaly  Lab Results:   Recent Labs  10/11/15 1201 10/12/15 0430  WBC 5.2 4.3  HGB 10.3* 8.7*  HCT 31.5* 27.0*  PLT 418* 350    BMET  Recent Labs  10/11/15 0538 10/12/15 0430  NA 137 135  K 3.7 3.8  CL 105 103  CO2 25 26  GLUCOSE 100* 92  BUN <5* <5*  CREATININE 0.55 0.63  CALCIUM 8.4* 8.4*   PT/INR No results for input(s): LABPROT, INR in the last 72 hours.  No results for input(s): AST, ALT, ALKPHOS, BILITOT, PROT, ALBUMIN in the last 168 hours.   Lipase  No results found for: LIPASE   Studies/Results:    Prior to Admission medications   Not on File    Medications: . diltiazem  180 mg Oral Daily  . docusate sodium  200 mg Oral BID  . enoxaparin (LOVENOX) injection  40 mg Subcutaneous Q24H  . folic acid  1 mg Oral Daily  . metoprolol tartrate  100 mg Oral BID  . multivitamin with minerals  1 tablet Oral Daily  .  polyethylene glycol  17 g Oral Daily  . potassium chloride  20 mEq Oral BID  . thiamine  100 mg Oral Daily     Assessment/Plan Fall L rib FX 2-8 with PTX - Pulmonary toilet ABL anemia - Improved ETOH abuse - CIWA, NH3 25 Tachycardia/suspect PAF - Appreciate cardiology consult, no anticoagulation at this time. Cards increased lopressor and started cardizem yesterday. Still with periods of tachycardia. Will need to touch base with cards regarding ongoing tachycardia Hypothyroid - TSH 6.82/T4 1.09 Skin mass R shoulder - may F/U with Dr. Grandville Silos at [facility] - D II diet VTE - SCD's, Lovenox Dispo - home when cleared by cards    Plan:  I am getting Cardiology to look at her and if OK we will plan discharge later today.  They are going to leave her at her current levels.  I have added home health nurse to check her BP's and help with right shoulder mass dressings until she is seen back by Dr. Grandville Silos.       LOS: 8 days    Maraya Gwilliam 10/12/2015 [phone]

## 2015-10-12 NOTE — Discharge Instructions (Signed)
Please increase dietary intake of healthy sources of magnesium including leafy greens, nuts, seeds, fish, beans, whole grains, avocados, yogurt, and bananas.   Blunt Chest Trauma Blunt chest trauma is an injury caused by a blow to the chest. These chest injuries can be very painful. Blunt chest trauma often results in bruised or broken (fractured) ribs. Most cases of bruised and fractured ribs from blunt chest traumas get better after 1 to 3 weeks of rest and pain medicine. Often, the soft tissue in the chest wall is also injured, causing pain and bruising. Internal organs, such as the heart and lungs, may also be injured. Blunt chest trauma can lead to serious medical problems. This injury requires immediate medical care. CAUSES   Motor vehicle collisions.  Falls.  Physical violence.  Sports injuries. SYMPTOMS   Chest pain. The pain may be worse when you move or breathe deeply.  Shortness of breath.  Lightheadedness.  Bruising.  Tenderness.  Swelling. DIAGNOSIS  Your caregiver will do a physical exam. X-rays may be taken to look for fractures. However, minor rib fractures may not show up on X-rays until a few days after the injury. If a more serious injury is suspected, further imaging tests may be done. This may include ultrasounds, computed tomography (CT) scans, or magnetic resonance imaging (MRI). TREATMENT  Treatment depends on the severity of your injury. Your caregiver may prescribe pain medicines and deep breathing exercises. HOME CARE INSTRUCTIONS  Limit your activities until you can move around without much pain.  Do not do any strenuous work until your injury is healed.  Put ice on the injured area.  Put ice in a plastic bag.  Place a towel between your skin and the bag.  Leave the ice on for 15-20 minutes, 03-04 times a day.  You may wear a rib belt as directed by your caregiver to reduce pain.  Practice deep breathing as directed by your caregiver to keep  your lungs clear.  Only take over-the-counter or prescription medicines for pain, fever, or discomfort as directed by your caregiver. SEEK IMMEDIATE MEDICAL CARE IF:   You have increasing pain or shortness of breath.  You cough up blood.  You have nausea, vomiting, or abdominal pain.  You have a fever.  You feel dizzy, weak, or you faint. MAKE SURE YOU:  Understand these instructions.  Will watch your condition.  Will get help right away if you are not doing well or get worse.   This information is not intended to replace advice given to you by your health care provider. Make sure you discuss any questions you have with your health care provider.   Document Released: 04/29/2004 Document Revised: 04/12/2014 Document Reviewed: 09/18/2014 Elsevier Interactive Patient Education 2016 [place]. Rib Fracture A rib fracture is a break or crack in one of the bones of the ribs. The ribs are like a cage that goes around your upper chest. A broken or cracked rib is often painful, but most do not cause other problems. Most rib fractures heal on their own in 1-3 months. HOME CARE  Avoid activities that cause pain to the injured area. Protect your injured area.  Slowly increase activity as told by your doctor.  Take medicine as told by your doctor.  Put ice on the injured area for the first 1-2 days after you have been treated or as told by your doctor.  Put ice in a plastic bag.  Place a towel between your skin and the bag.  Leave the ice on for 15-20 minutes at a time, every 2 hours while you are awake.  Do deep breathing as told by your doctor. You may be told to:  Take deep breaths many times a day.  Cough many times a day while hugging a pillow.  Use a device (incentive spirometer) to perform deep breathing many times a day.  Drink enough fluids to keep your pee (urine) clear or pale yellow.   Do not wear a rib belt or binder. These do not allow you to breathe  deeply. GET HELP RIGHT AWAY IF:   You have a fever.  You have trouble breathing.   You cannot stop coughing.  You cough up thick or bloody spit (mucus).   You feel sick to your stomach (nauseous), throw up (vomit), or have belly (abdominal) pain.   Your pain gets worse and medicine does not help.  MAKE SURE YOU:   Understand these instructions.  Will watch your condition.  Will get help right away if you are not doing well or get worse.   This information is not intended to replace advice given to you by your health care provider. Make sure you discuss any questions you have with your health care provider.   Document Released: 12/30/2007 Document Revised: 07/17/2012 Document Reviewed: 05/24/2012 Elsevier Interactive Patient Education 2016 Elsevier Inc[employer]  Dysphagia Diet Level 2, Mechanically Altered The dysphagia level 2 diet includes foods that are blended, chopped, ground, or mashed so they are easier to chew and swallow. The foods are soft, moist, and can be chopped into -inch chunks (such as pancakes, pasta, and bananas). In order to be on this diet, you must be able to chew. This diet helps you transition between the pureed textures of the dysphagia level 1 diet to more solid textures. This diet is helpful for people with mild to moderate swallowing difficulties. It reduces the risk of food getting caught in the windpipe, trachea, or lungs.  You may need help or supervision during meals while following this diet so that you eat safely. You will be on this diet until your health care provider advances the texture of your diet.  WHAT DO I NEED TO KNOW ABOUT THIS DIET? Foods  You may eat foods that are soft and moist.  You may need to use a blender, whisk, or masher to soften some of your foods.  You can moisten foods with gravies, sauces, vegetable or fruit juice, milk, half and half, or water when blending, mashing, or grinding your foods to the right consistency.  If  you were on the dysphagia level 1 diet, you may still eat any of the foods included in that diet.  Avoid foods that are dry, hard, sticky, chewy, coarse, and crunchy. Also avoid large cuts of food.  Take small bites. Each bite should contain  inch or less of food. Liquids  Avoid liquids with seeds and chunks.  Thicken liquids, if instructed by your health care provider. Your health care provider will tell you the consistency to which you should thicken your liquids for safe swallowing. To thicken a liquid, use a commercial thickener or a thickening food (such as rice cereal or potato flakes). Ask your health care provider for specific recommendations on thickeners. See your dietitian or health care provider regularly for help with your dietary changes. WHAT FOODS CAN I EAT? Grains Store-bought soft breads that do not have nuts or seeds. Pancakes, sweet rolls, [place] pastries, and [place] toast that have  been moistened with syrup or sauce to form a slurry when blended. Well-cooked pasta, noodles, and bread dressing. Well-cooked noodles and pasta in sauce. Moist macaroni and cheese. Soft dumplings or spaetzle with gravy or butter. Cooked cereals (including oatmeal). Low-texture dry cereals, such as rice puff, corn, or wheat-flake cereals, with milk (if thin liquids are not allowed, make sure all of the milk is absorbed by the cereal before eating it). Vegetables Very soft, well-cooked vegetables in pieces less than  inch in size. Cooked potatoes that are moist, not crispy, and with sauce. Fruits Canned or cooked fruits that are soft or moist and do not have skin or seeds. Fresh, soft bananas. Fruit juices with a small amount of pulp (if thin liquids are allowed). Gelatin or plain gelatin with canned fruit, except pineapple. Meat and Other Protein Sources Tender, moist meats, poultry, or fish cooked with gravy or sauce and cubed to -inch bites or smaller. Ground meat. Moist meatball or meatloaf.  Fish without bones. Moist casseroles without rice. Tuna, egg, or meat salad without chunks or hard-to-chew vegetables, such as celery and onions. Smooth quiche without large chunks. Scrambled, poached, or soft-cooked eggs with butter, margarine, sauce, or gravy. Tofu. Well-cooked, moistened and mashed beans, peas, baked beans, and other legumes. Casseroles without rice (such as tuna noodle casserole or soft moist meat lasagna). Dairy Cream cheese. Yogurt. Cottage cheese. Ask your health care provider if milk is allowed. Sweets/Desserts Pudding. Custard. Soft fruit pies with crust on the bottom only. Crisps and cobblers without seeds or nuts and with soft crusts. Soft, moist cakes. Icing. Pre-gelled cookies. Soft, moist cookies dunked in milk, coffee, or another liquid. Jelly. Soft, smooth chocolate bars that are easily chewed. Jams and preserves without seeds. Ask your health care provider whether you can have frozen desserts. Fats and Oils Butter. Margarine. Cream for cereal, depending on liquid consistency allowed. Gravy. Cream sauces. Mayonnaise. Salad dressings. Cream cheese. Cheese spreads, plain or with soft fruits or vegetables added. Sour cream. Sour cream dips with soft fruits or vegetables added. Whipped toppings. Other Sauces and salsas that have soft chunks that are about  inch or smaller. The items listed above may not be a complete list of recommended foods or beverages. Contact your dietitian for more options. WHAT FOODS ARE NOT RECOMMENDED? Grains All breads not listed in the recommended list. Breads that are hard or have nuts or seeds. Coarse cereals. Cereals that have nuts, seeds, dried fruits, or coconut. Rice. Corn. Vegetables Whole, raw, frozen, or dried vegetables. Tough, fibrous, chewy, or stringy cooked vegetables, such as celery, peas, broccoli, cabbage, Brussels sprouts, and asparagus. Potato skins. Potato and other vegetable chips. Fried or French-fried potatoes. Cooked corn  and peas. Fruits Whole raw, frozen, or dried fruits, including coconut. Pineapple. Fruits with seeds. Meat and Other Protein Sources Dry, tough meats, such as bacon, sausage, and hot dogs. Cheese slices and cubes. Peanut butter. Hard boiled or fried eggs. Nuts. Seeds. Pizza. Sandwiches. Dry casseroles or casseroles with rice or large chunks. Dairy Yogurt with nuts, seeds, or large chunks. Sweets/Desserts Coarse, hard, chewy, or sticky desserts. Any dessert with nuts, seeds, coconut, pineapple, or dried fruit. Ask your health care provider whether you can have frozen desserts. Fats and Oils Avoid fats with chunky, large textures, such as those with nuts or fruits. Other Soups and casseroles with large chunks. The items listed above may not be a complete list of foods and beverages to avoid. Contact your dietitian for more information.  This information is not intended to replace advice given to you by your health care provider. Make sure you discuss any questions you have with your health care provider.   Document Released: 03/22/2005 Document Revised: 04/12/2014 Document Reviewed: 03/05/2013 Elsevier Interactive Patient Education [employer].

## 2015-10-12 NOTE — Care Management (Signed)
CM spoke with patient and her daughter at the bedside. Jermaine at [facility] will deliver the 3N1 prior to discharge today. The hospital bed has arrived at patient's home. Patient will ride home with her daughter. Presenter, broadcasting BSN CCM

## 2015-10-12 NOTE — Discharge Summary (Signed)
Physician Discharge Summary  Patient ID: Lisa Chan MRN: [id number] DOB/AGE: 80-13-1928 80 y.o.  Admit date: 10/04/2015 Discharge date: 10/12/2015  Admission Diagnoses:   Mechanical fall tripped on carpet with two prior falls Multiple displaced left rib fractures (2-8 with moderate hemothorax and pneumothorax Right skin lesion  Right elbow skin tear.   ETOH use Severe arthritis with some hand changes. Tachycardia with HR 130-140's    DNR  Discharge Diagnoses:  Active Problems:   Multiple rib fractures   Fall   Acute blood loss anemia   Mass of skin of right shoulder   Alcohol abuse   Traumatic hemopneumothorax   Arrhythmia   Tachycardia   Premature atrial contractions   PROCEDURES: CT insertion Left at [facility] Course:  Lisa Chan tripped over a rug at home and fell striking her left chest wall. She was evaluated at [facility] emergency department. She was found to have left rib fractures 2-8 with associated pneumothorax. I accepted her in transfer to [facility]. Prior to transfer, a left chest tube was placed by the surgeon There. Lisa Chan C/O L chest pain and dry mouth. Her daughter is hwere and reports that Lisa Chan has been drinking at least 4 vodka drinks daily.  She was transferred to [facility] from [facility].   After admit she was very slow to recover, she was placed on CIWA protocol.  She is fairly frail.  She has a large mass on her right shoulder that was dressed with plans to address it at a later time as an out patient..   She developed tachycardia, with rates up to the 130-140 range.  She was seen by Cardiology.  She has refused to see any doctor in years, her TSH was very elevated, but normal T4.  She had a goals of care discussion with Dr/. Grandville Silos and was made a DNI/DNR, on 10/09/15.  CT was removed on 10/09/15. With swallowing she was seen by Speech therapy and evaluation recommended.  They recommended a D II diet.  Her daughter said she wanted to  go home so Home Health, home health equipment, OT, PT. And RN requested.   She is stable with some ongoing MAT, PAC, PVC'S ON strips, Dr. Radford Pax recommends she go home on current meds.  i discussed orthostatic hypotension with patient and her daughter.  CBC Latest Ref Rng 10/12/2015 10/11/2015 10/08/2015  WBC 4.0 - 10.5 K/uL 4.3 5.2 6.1  Hemoglobin 12.0 - 15.0 g/dL 8.7(L) 10.3(L) 10.0(L)  Hematocrit 36.0 - 46.0 % 27.0(L) 31.5(L) 30.0(L)  Platelets 150 - 400 K/uL 350 418(H) 252   CMP Latest Ref Rng 10/12/2015 10/11/2015 10/10/2015  Glucose 65 - 99 mg/dL 92 100(H) 105(H)  BUN 6 - 20 mg/dL <5(L) <5(L) <5(L)  Creatinine 0.44 - 1.00 mg/dL 0.63 0.55 0.61  Sodium 135 - 145 mmol/L 135 137 138  Potassium 3.5 - 5.1 mmol/L 3.8 3.7 3.4(L)  Chloride 101 - 111 mmol/L 103 105 108  CO2 22 - 32 mmol/L 26 25 23   Calcium 8.9 - 10.3 mg/dL 8.4(L) 8.4(L) 8.3(L)   TSH:6.82/T4 1.09  CXR 10/09/15:  Interim removal of left chest tube. Tiny left apical pneumothorax is again noted and stable. Heart size stable. Low lung volumes with bibasilar atelectasis and/or infiltrates. Small left pleural effusion. Multiple displaced left rib fractures again noted. Humeral fractures and clavicular fractures again noted, these may be old. Stable mild left chest wall subcutaneous emphysema.  IMPRESSION: 1. Interim removal of left chest tube. Stable tiny left  apical pneumothorax. Stable mild left chest wall subcutaneous emphysema .  2. Low lung volumes with bibasilar atelectasis and/or infiltrates. Small left pleural effusion.  3. Multiple displaced left rib fractures again noted. Humeral fracture is and clavicular fractures again noted, these may be old.  Condition on d/c:  improving  Disposition: Home      Discharge Instructions    DME Other see comment    Complete by:  As directed   Hoyer lift     For home use only DME 3 n 1    Complete by:  As directed      For home use only DME 3 n 1    Complete by:  As directed       For home use only DME standard manual wheelchair with seat cushion    Complete by:  As directed   Patient suffers from multiple rib fracture which impairs their ability to perform daily activities like bathing in the home.  A walker will not resolve  issue with performing activities of daily living. A wheelchair will allow patient to safely perform daily activities. Patient can safely propel the wheelchair in the home or has a caregiver who can provide assistance.  Accessories: elevating leg rests (ELRs), wheel locks, extensions and anti-tippers.            Medication List    TAKE these medications        acetaminophen 325 MG tablet  Commonly known as:  TYLENOL  Take 2 tablets (650 mg total) by mouth every 6 (six) hours as needed for mild pain or fever.     diltiazem 180 MG 24 hr capsule  Commonly known as:  CARDIZEM CD  Take 1 capsule (180 mg total) by mouth daily.     docusate sodium 100 MG capsule  Commonly known as:  COLACE  If her stools become to soft, or loose you can stop this.     metoprolol 100 MG tablet  Commonly known as:  LOPRESSOR  Take 1 tablet (100 mg total) by mouth 2 (two) times daily.     multivitamin with minerals Tabs tablet  You can get a Women's One a day type vitamin for use at home.  You can buy at any pharmacy.     traMADol 50 MG tablet  Commonly known as:  ULTRAM  Take 1-2 tablets (50-100 mg total) by mouth every 6 (six) hours as needed (50mg  for mild pain, 75mg  for moderate pain, 100mg  for severe pain).       Follow-up Information    Follow up with Zenovia Jarred, MD On 10/20/2015.   Specialty:  General Surgery   Why:  10:10AM.  Keep a clean dressing over the open site on your   Contact information:   [place] [address] [phone]       Follow up with Primary Care.   Why:  Please establish care with a primary care doctor to recheck your thyroid function in 2-3 weeks. To monitor thyroid, and blood pressure/heart  rate.      Follow up with Candee Furbish, MD.   Specialty:  Cardiology   Why:  Call and make an appointment in 2 weeks.  You have no refills on the heart rate medicines.   Contact information:   [id number] [place] [address] [phone]       Follow up with [facility].   Why:  physical therapy, occupational therapy  and nurse   Contact information:   [address] [place] [address] [phone]       Signed: Earnstine Regal 10/12/2015, 12:26 PM

## 2015-10-13 DIAGNOSIS — R269 Unspecified abnormalities of gait and mobility: Secondary | ICD-10-CM | POA: Diagnosis not present

## 2015-10-13 DIAGNOSIS — S2231XA Fracture of one rib, right side, initial encounter for closed fracture: Secondary | ICD-10-CM | POA: Diagnosis not present

## 2015-10-13 DIAGNOSIS — S2242XD Multiple fractures of ribs, left side, subsequent encounter for fracture with routine healing: Secondary | ICD-10-CM | POA: Diagnosis not present

## 2015-10-13 DIAGNOSIS — S41101D Unspecified open wound of right upper arm, subsequent encounter: Secondary | ICD-10-CM | POA: Diagnosis not present

## 2015-10-13 DIAGNOSIS — W19XXXD Unspecified fall, subsequent encounter: Secondary | ICD-10-CM | POA: Diagnosis not present

## 2015-10-13 DIAGNOSIS — M1990 Unspecified osteoarthritis, unspecified site: Secondary | ICD-10-CM | POA: Diagnosis not present

## 2015-10-13 DIAGNOSIS — Z9181 History of falling: Secondary | ICD-10-CM | POA: Diagnosis not present

## 2015-10-13 DIAGNOSIS — F1010 Alcohol abuse, uncomplicated: Secondary | ICD-10-CM | POA: Diagnosis not present

## 2015-10-13 DIAGNOSIS — S272XXD Traumatic hemopneumothorax, subsequent encounter: Secondary | ICD-10-CM | POA: Diagnosis not present

## 2015-10-14 DIAGNOSIS — S272XXD Traumatic hemopneumothorax, subsequent encounter: Secondary | ICD-10-CM | POA: Diagnosis not present

## 2015-10-14 DIAGNOSIS — S41101D Unspecified open wound of right upper arm, subsequent encounter: Secondary | ICD-10-CM | POA: Diagnosis not present

## 2015-10-14 DIAGNOSIS — W19XXXD Unspecified fall, subsequent encounter: Secondary | ICD-10-CM | POA: Diagnosis not present

## 2015-10-14 DIAGNOSIS — F1010 Alcohol abuse, uncomplicated: Secondary | ICD-10-CM | POA: Diagnosis not present

## 2015-10-14 DIAGNOSIS — Z9181 History of falling: Secondary | ICD-10-CM | POA: Diagnosis not present

## 2015-10-14 DIAGNOSIS — M1990 Unspecified osteoarthritis, unspecified site: Secondary | ICD-10-CM | POA: Diagnosis not present

## 2015-10-14 DIAGNOSIS — S2242XD Multiple fractures of ribs, left side, subsequent encounter for fracture with routine healing: Secondary | ICD-10-CM | POA: Diagnosis not present

## 2015-10-16 DIAGNOSIS — F1010 Alcohol abuse, uncomplicated: Secondary | ICD-10-CM | POA: Diagnosis not present

## 2015-10-16 DIAGNOSIS — S41101D Unspecified open wound of right upper arm, subsequent encounter: Secondary | ICD-10-CM | POA: Diagnosis not present

## 2015-10-16 DIAGNOSIS — M1990 Unspecified osteoarthritis, unspecified site: Secondary | ICD-10-CM | POA: Diagnosis not present

## 2015-10-16 DIAGNOSIS — W19XXXD Unspecified fall, subsequent encounter: Secondary | ICD-10-CM | POA: Diagnosis not present

## 2015-10-16 DIAGNOSIS — S2242XD Multiple fractures of ribs, left side, subsequent encounter for fracture with routine healing: Secondary | ICD-10-CM | POA: Diagnosis not present

## 2015-10-16 DIAGNOSIS — Z9181 History of falling: Secondary | ICD-10-CM | POA: Diagnosis not present

## 2015-10-16 DIAGNOSIS — S272XXD Traumatic hemopneumothorax, subsequent encounter: Secondary | ICD-10-CM | POA: Diagnosis not present

## 2015-10-20 DIAGNOSIS — R2231 Localized swelling, mass and lump, right upper limb: Secondary | ICD-10-CM | POA: Diagnosis not present

## 2015-10-20 DIAGNOSIS — R350 Frequency of micturition: Secondary | ICD-10-CM | POA: Diagnosis not present

## 2015-10-22 DIAGNOSIS — M1990 Unspecified osteoarthritis, unspecified site: Secondary | ICD-10-CM | POA: Diagnosis not present

## 2015-10-22 DIAGNOSIS — W19XXXD Unspecified fall, subsequent encounter: Secondary | ICD-10-CM | POA: Diagnosis not present

## 2015-10-22 DIAGNOSIS — F1010 Alcohol abuse, uncomplicated: Secondary | ICD-10-CM | POA: Diagnosis not present

## 2015-10-22 DIAGNOSIS — Z9181 History of falling: Secondary | ICD-10-CM | POA: Diagnosis not present

## 2015-10-22 DIAGNOSIS — S272XXD Traumatic hemopneumothorax, subsequent encounter: Secondary | ICD-10-CM | POA: Diagnosis not present

## 2015-10-22 DIAGNOSIS — S2242XD Multiple fractures of ribs, left side, subsequent encounter for fracture with routine healing: Secondary | ICD-10-CM | POA: Diagnosis not present

## 2015-10-22 DIAGNOSIS — S41101D Unspecified open wound of right upper arm, subsequent encounter: Secondary | ICD-10-CM | POA: Diagnosis not present

## 2015-10-24 ENCOUNTER — Encounter: Payer: Self-pay | Admitting: Nurse Practitioner

## 2015-10-24 ENCOUNTER — Ambulatory Visit (INDEPENDENT_AMBULATORY_CARE_PROVIDER_SITE_OTHER): Payer: Medicare PPO | Admitting: Nurse Practitioner

## 2015-10-24 ENCOUNTER — Encounter (INDEPENDENT_AMBULATORY_CARE_PROVIDER_SITE_OTHER): Payer: Self-pay

## 2015-10-24 VITALS — BP 110/64 | HR 92 | Ht 61.0 in | Wt 97.0 lb

## 2015-10-24 DIAGNOSIS — R000 Tachycardia, unspecified: Secondary | ICD-10-CM | POA: Diagnosis not present

## 2015-10-24 DIAGNOSIS — F1010 Alcohol abuse, uncomplicated: Secondary | ICD-10-CM | POA: Diagnosis not present

## 2015-10-24 LAB — BASIC METABOLIC PANEL
BUN: 7 mg/dL (ref 7–25)
CO2: 30 mmol/L (ref 20–31)
Calcium: 8.4 mg/dL — ABNORMAL LOW (ref 8.6–10.4)
Chloride: 98 mmol/L (ref 98–110)
Creat: 0.7 mg/dL (ref 0.60–0.88)
Glucose, Bld: 100 mg/dL — ABNORMAL HIGH (ref 65–99)
Potassium: 3.4 mmol/L — ABNORMAL LOW (ref 3.5–5.3)
Sodium: 141 mmol/L (ref 135–146)

## 2015-10-24 LAB — CBC
HCT: 32.4 % — ABNORMAL LOW (ref 35.0–45.0)
Hemoglobin: 11.2 g/dL — ABNORMAL LOW (ref 11.7–15.5)
MCH: 33.6 pg — ABNORMAL HIGH (ref 27.0–33.0)
MCHC: 34.6 g/dL (ref 32.0–36.0)
MCV: 97.3 fL (ref 80.0–100.0)
MPV: 9.8 fL (ref 7.5–12.5)
Platelets: 352 10*3/uL (ref 140–400)
RBC: 3.33 MIL/uL — ABNORMAL LOW (ref 3.80–5.10)
RDW: 13.7 % (ref 11.0–15.0)
WBC: 5.3 10*3/uL (ref 3.8–10.8)

## 2015-10-24 LAB — TSH: TSH: 2.46 mIU/L

## 2015-10-24 NOTE — Patient Instructions (Addendum)
We will be checking the following labs today - BMET, CBC and TSH   Medication Instructions:    Continue with your current medicines.     Testing/Procedures To Be Arranged:  N/A  Follow-Up:   See me in 4 months   Other Special Instructions:   I would recommend getting a primary care doctor.    If you need a refill on your cardiac medications before your next appointment, please call your pharmacy.   Call the [place] office at [phone] if you have any questions, problems or concerns.

## 2015-10-24 NOTE — Progress Notes (Signed)
CARDIOLOGY OFFICE NOTE  Date:  10/24/2015    Lisa Chan Date of Birth: [birth year] Medical Record [id number]  PCP:  No PCP Per Patient  Cardiologist:  Marlou Porch   Chief Complaint  Patient presents with  . Irregular Heart Beat    Post hospital visit - seen for Dr. Marlou Porch    History of Present Illness: Lisa Chan is a 82 y.o. female who presents today for a post hospital visit. Seen for Dr. Marlou Porch.   She has ongoing alcohol abuse and arthritis who was recently admitted following a mechanical fall c/b multiple rib fractures, pneumothorax, ABL anemia. Cardiology following for episodic tachycardia/possible PAF.  Her rhythm primarily appeared to be sinus rhythm with gradual trend towards sinus tach with PACs versus MAT. Higher rates seem to correlate during changes in position or activity. HR in 2014 was 93-101 so she may tend to run on higher range.  HR at rest are in the 80s-90s. It was suspected that her elevated HRs were due to deconditioning, anemia, agitation and possible component of alcohol withdrawal. There was a question of afib earlier in hospital stay but not felt to be true. Regardless even if felt to represent AF, she would not be a good candidate for anticoagulation given instability, fall risk, and ongoing alcohol abuse in the presence of recent ABL anemia. Her TSH high but free T4 normal. She does not have primary care.  She was to be managed conservatively. Looks like she is pretty resistant to taking medicines.   Comes back today. Here with daughter [name]. They both say that things are going ok. She feels better. She says she is "fine but the heat is enough to kill someone". Did have a spell last night where she felt hot - they turned the air down and she drank some water and she felt better. She has had some intermittent trouble swallowing her pills - has taken them consistently for the most part. Does better if she takes them with cantaloupe. She is back drinking  alcohol - but less. Family has removed all the rugs in her home. She has been back to see Dr. Grandville Silos and got a good report. They are establishing with a PCP next week. She has no chest pain. Not really short of breath. No palpitations. Not dizzy or lightheaded.   Past Medical History  Diagnosis Date  . Arthritis   . Alcohol abuse   . Fall   . Rib fracture     Past Surgical History  Procedure Laterality Date  . Abdominal hysterectomy       Medications: Current Outpatient Prescriptions  Medication Sig Dispense Refill  . diltiazem (CARDIZEM CD) 180 MG 24 hr capsule Take 1 capsule (180 mg total) by mouth daily. 30 capsule 0  . metoprolol (LOPRESSOR) 100 MG tablet Take 1 tablet (100 mg total) by mouth 2 (two) times daily. 60 tablet 0   No current facility-administered medications for this visit.    Allergies: No Known Allergies  Social History: The patient  reports that she has never smoked. She does not have any smokeless tobacco history on file. She reports that she drinks about 12.6 oz of alcohol per week.   Family History: The patient's family history includes Arrhythmia in her father and mother; Hypertension in her father and mother.   Review of Systems: Please see the history of present illness.   Otherwise, the review of systems is positive for none.   All other systems  are reviewed and negative.   Physical Exam: VS:  BP 110/64 mmHg  Pulse 92  Ht 5\' 1"  (1.549 m)  Wt 97 lb (43.999 kg)  BMI 18.34 kg/m2 .  BMI Body mass index is 18.34 kg/(m^2).  Wt Readings from Last 3 Encounters:  10/24/15 97 lb (43.999 kg)  10/04/15 99 lb 3.3 oz (45 kg)    General: Elderly female. Looks chronically ill but alert and in no acute distress. HEENT: Normal. Neck: Supple, no JVD, carotid bruits, or masses noted.  Cardiac: Somewhat irregular rhythm.  No murmurs, rubs, or gallops. No edema.  Respiratory:  Lungs are clear to auscultation bilaterally with normal work of breathing.  GI:  Soft and nontender.  MS: No deformity or atrophy. Gait not tested. She was brought in a wheelchair. ROM intact. Skin: Warm and dry. Color is normal.  Neuro:  Strength and sensation are intact and no gross focal deficits noted.  Psych: Alert, appropriate and with normal affect.   LABORATORY DATA:  EKG:  EKG is ordered today. This demonstrates sinus rhythm, blocked PAC. Her HR today is 93.  Lab Results  Component Value Date   WBC 4.3 10/12/2015   HGB 8.7* 10/12/2015   HCT 27.0* 10/12/2015   PLT 350 10/12/2015   GLUCOSE 92 10/12/2015   ALT 37 02/20/2013   AST 53* 02/20/2013   NA 135 10/12/2015   K 3.8 10/12/2015   CL 103 10/12/2015   CREATININE 0.63 10/12/2015   BUN <5* 10/12/2015   CO2 26 10/12/2015   TSH 6.829* 10/11/2015    BNP (last 3 results) No results for input(s): BNP in the last 8760 hours.  ProBNP (last 3 results) No results for input(s): PROBNP in the last 8760 hours.   Other Studies Reviewed Today:   Assessment/Plan: 1. Tachycardia - on both beta blocker and CCB therapy - EKG today shows improving HR. Looks to be back at her baseline. We will leave her on her current regimen. If she were to have AF - would not place on anticoagulation.   2. Alcohol abuse - she has cut back  3. Falls with rib fracture and associated blood loss anemia - recent visit with trauma MD. Rechecking her lab today  4. Advanced age.   5. Elevated TSH - recheck today. She is establishing with PCP next week  Current medicines are reviewed with the patient today.  The patient does not have concerns regarding medicines other than what has been noted above.  The following changes have been made:  See above.  Labs/ tests ordered today include:    Orders Placed This Encounter  Procedures  . Basic metabolic panel  . CBC  . TSH  . EKG 12-Lead     Disposition:   FU here in about 4 months.    Patient is agreeable to this plan and will call if any problems develop in the interim.    Signed: Burtis Junes, RN, ANP-C 10/24/2015 2:13 PM  [facility] [address] [place], [place]  [address] Phone: [phone] Fax: [phone]

## 2015-10-27 DIAGNOSIS — F1010 Alcohol abuse, uncomplicated: Secondary | ICD-10-CM | POA: Diagnosis not present

## 2015-10-27 DIAGNOSIS — W19XXXD Unspecified fall, subsequent encounter: Secondary | ICD-10-CM | POA: Diagnosis not present

## 2015-10-27 DIAGNOSIS — Z9181 History of falling: Secondary | ICD-10-CM | POA: Diagnosis not present

## 2015-10-27 DIAGNOSIS — S272XXD Traumatic hemopneumothorax, subsequent encounter: Secondary | ICD-10-CM | POA: Diagnosis not present

## 2015-10-27 DIAGNOSIS — S41101D Unspecified open wound of right upper arm, subsequent encounter: Secondary | ICD-10-CM | POA: Diagnosis not present

## 2015-10-27 DIAGNOSIS — S2242XD Multiple fractures of ribs, left side, subsequent encounter for fracture with routine healing: Secondary | ICD-10-CM | POA: Diagnosis not present

## 2015-10-27 DIAGNOSIS — M1990 Unspecified osteoarthritis, unspecified site: Secondary | ICD-10-CM | POA: Diagnosis not present

## 2015-10-28 ENCOUNTER — Telehealth: Payer: Self-pay | Admitting: Family Medicine

## 2015-10-28 ENCOUNTER — Ambulatory Visit: Payer: Self-pay | Admitting: General Surgery

## 2015-10-28 NOTE — Telephone Encounter (Signed)
Spoke with [name] and advised due to not seeing the patient we can not give medical advise. If she des have any problems with patient she would need to take to urgent care or ED.

## 2015-10-28 NOTE — Telephone Encounter (Signed)
Lisa Chan called from [facility] wanting Dr Caryl Bis to be aware of pt Oxygen. Level is 82 when she talks and breathing exercise goes up to 90. Pt is always tired no energy sleepy. I did advise [name] that pt has not been seen yet. She states she wanted the doctor to be aware. Thank you!

## 2015-10-28 NOTE — Telephone Encounter (Signed)
Noted thanks °

## 2015-10-29 NOTE — Telephone Encounter (Signed)
Ok

## 2015-10-29 NOTE — Telephone Encounter (Signed)
Agree with CMA that patient should be evaluated in urgent care ED as we have not previously seen the patient.

## 2015-10-30 DIAGNOSIS — S272XXD Traumatic hemopneumothorax, subsequent encounter: Secondary | ICD-10-CM | POA: Diagnosis not present

## 2015-10-30 DIAGNOSIS — Z9181 History of falling: Secondary | ICD-10-CM | POA: Diagnosis not present

## 2015-10-30 DIAGNOSIS — M1990 Unspecified osteoarthritis, unspecified site: Secondary | ICD-10-CM | POA: Diagnosis not present

## 2015-10-30 DIAGNOSIS — S41101D Unspecified open wound of right upper arm, subsequent encounter: Secondary | ICD-10-CM | POA: Diagnosis not present

## 2015-10-30 DIAGNOSIS — S2242XD Multiple fractures of ribs, left side, subsequent encounter for fracture with routine healing: Secondary | ICD-10-CM | POA: Diagnosis not present

## 2015-10-30 DIAGNOSIS — W19XXXD Unspecified fall, subsequent encounter: Secondary | ICD-10-CM | POA: Diagnosis not present

## 2015-10-30 DIAGNOSIS — F1010 Alcohol abuse, uncomplicated: Secondary | ICD-10-CM | POA: Diagnosis not present

## 2015-10-31 ENCOUNTER — Ambulatory Visit (INDEPENDENT_AMBULATORY_CARE_PROVIDER_SITE_OTHER): Payer: Medicare PPO | Admitting: Family Medicine

## 2015-10-31 ENCOUNTER — Encounter: Payer: Self-pay | Admitting: Family Medicine

## 2015-10-31 ENCOUNTER — Emergency Department: Payer: Medicare PPO

## 2015-10-31 ENCOUNTER — Encounter: Payer: Self-pay | Admitting: Emergency Medicine

## 2015-10-31 ENCOUNTER — Emergency Department
Admission: EM | Admit: 2015-10-31 | Discharge: 2015-10-31 | Disposition: A | Discharge status: Home / Self Care | Payer: Medicare PPO | Attending: Emergency Medicine | Admitting: Emergency Medicine

## 2015-10-31 VITALS — BP 128/86 | HR 56 | Temp 98.2°F | Ht 61.0 in | Wt 98.0 lb

## 2015-10-31 DIAGNOSIS — J90 Pleural effusion, not elsewhere classified: Secondary | ICD-10-CM | POA: Diagnosis not present

## 2015-10-31 DIAGNOSIS — R0602 Shortness of breath: Secondary | ICD-10-CM | POA: Diagnosis not present

## 2015-10-31 DIAGNOSIS — R0902 Hypoxemia: Secondary | ICD-10-CM | POA: Diagnosis not present

## 2015-10-31 DIAGNOSIS — Z79899 Other long term (current) drug therapy: Secondary | ICD-10-CM | POA: Diagnosis not present

## 2015-10-31 DIAGNOSIS — S2242XG Multiple fractures of ribs, left side, subsequent encounter for fracture with delayed healing: Secondary | ICD-10-CM | POA: Diagnosis not present

## 2015-10-31 DIAGNOSIS — I499 Cardiac arrhythmia, unspecified: Secondary | ICD-10-CM

## 2015-10-31 LAB — BASIC METABOLIC PANEL
Anion gap: 7 (ref 5–15)
BUN: 9 mg/dL (ref 6–20)
CALCIUM: 8.6 mg/dL — AB (ref 8.9–10.3)
CO2: 34 mmol/L — ABNORMAL HIGH (ref 22–32)
Chloride: 99 mmol/L — ABNORMAL LOW (ref 101–111)
Creatinine, Ser: 0.57 mg/dL (ref 0.44–1.00)
GFR calc Af Amer: >60 mL/min (ref >60)
GLUCOSE: 138 mg/dL — AB (ref 65–99)
POTASSIUM: 3 mmol/L — AB (ref 3.5–5.1)
Sodium: 140 mmol/L (ref 135–145)

## 2015-10-31 LAB — TROPONIN I: Troponin I: 0.03 ng/mL (ref <0.03)

## 2015-10-31 LAB — CBC
HEMATOCRIT: 37.2 % (ref 35.0–47.0)
Hemoglobin: 12.7 g/dL (ref 12.0–16.0)
MCH: 33.9 pg (ref 26.0–34.0)
MCHC: 34.1 g/dL (ref 32.0–36.0)
MCV: 99.5 fL (ref 80.0–100.0)
Platelets: 242 10*3/uL (ref 150–440)
RBC: 3.74 MIL/uL — ABNORMAL LOW (ref 3.80–5.20)
RDW: 13.9 % (ref 11.5–14.5)
WBC: 4 10*3/uL (ref 3.6–11.0)

## 2015-10-31 LAB — ETHANOL

## 2015-10-31 MED ORDER — SODIUM CHLORIDE 0.9 % IV BOLUS (SEPSIS)
1000.0000 mL | Freq: Once | INTRAVENOUS | Status: AC
  Administered 2015-10-31: 1000 mL via INTRAVENOUS

## 2015-10-31 MED ORDER — IOPAMIDOL (ISOVUE-370) INJECTION 76%
100.0000 mL | Freq: Once | INTRAVENOUS | Status: AC | PRN
  Administered 2015-10-31: 100 mL via INTRAVENOUS

## 2015-10-31 MED ORDER — IPRATROPIUM-ALBUTEROL 0.5-2.5 (3) MG/3ML IN SOLN
3.0000 mL | Freq: Once | RESPIRATORY_TRACT | Status: AC
  Administered 2015-10-31: 3 mL via RESPIRATORY_TRACT
  Filled 2015-10-31: qty 3

## 2015-10-31 MED ORDER — LEVOFLOXACIN 500 MG PO TABS: 500.0000 mg | ORAL_TABLET | Freq: Every day | ORAL | 0 refills | Status: AC

## 2015-10-31 MED ORDER — LEVOFLOXACIN 500 MG PO TABS
500.0000 mg | ORAL_TABLET | Freq: Every day | ORAL | Status: DC
  Administered 2015-10-31: 500 mg via ORAL
  Filled 2015-10-31: qty 1

## 2015-10-31 MED ORDER — POTASSIUM CHLORIDE CRYS ER 20 MEQ PO TBCR
40.0000 meq | EXTENDED_RELEASE_TABLET | Freq: Once | ORAL | Status: DC
  Filled 2015-10-31: qty 2

## 2015-10-31 NOTE — ED Triage Notes (Signed)
Pt's daughter states pt fell 3 weeks ago and has cracked ribs on the left side, pt was admitted for a week and a half in [place]. Pt was following up with a new primary care dr who sent pt to the ER for labored breathing, pt hasn't had her "heart medications" today that were newly prescribed 3 weeks ago. Pt found to have an elevated heart rate of 145 and is irreg, daughter states that she just needs home O2

## 2015-10-31 NOTE — ED Provider Notes (Signed)
[facility] Provider Note   CSN: [id number] Arrival date & time: 10/31/15  1513  First Provider Contact:  First MD Initiated Contact with Patient 10/31/15 1601        History   Chief Complaint Chief Complaint  Patient presents with  . Shortness of Breath    HPI Lisa Chan is a 82 y.o. female hx of alcohol abuse, Recent rib fracture status post chest tube that was removed here presenting with shortness of breath, hypoxia. Patient had a complicated rib fracture and was admitted and was discharged several weeks ago. Patient did not want to go to rehabilitation or nursing home so went home with the daughter. Patient remained hypoxia at home and persistently tachycardic. She walks with a walker. Denies fever or chills, has stable cough. Denies further alcohol use. She went to follow up with CT surgery and trauma surgery but was unable to get oxygen outpatient. She saw Dr. Caryl Bis, PCP, and sent here to get oxygen set up for home. Her oxygen was 87 % on RA in the office.   The history is provided by the patient.    Past Medical History:  Diagnosis Date  . Alcohol abuse   . Arthritis   . Fall   . Rib fracture     Patient Active Problem List   Diagnosis Date Noted  . Tachycardia 10/11/2015  . Premature atrial contractions 10/11/2015  . Arrhythmia 10/10/2015  . Fall 10/08/2015  . Acute blood loss anemia 10/08/2015  . Mass of skin of right shoulder 10/08/2015  . Alcohol abuse 10/08/2015  . Traumatic hemopneumothorax 10/08/2015  . Multiple rib fractures 10/04/2015    Past Surgical History:  Procedure Laterality Date  . ABDOMINAL HYSTERECTOMY      OB History    No data available       Home Medications    Prior to Admission medications   Medication Sig Start Date End Date Taking? Authorizing Provider  diltiazem (CARDIZEM CD) 180 MG 24 hr capsule Take 1 capsule (180 mg total) by mouth daily. 10/12/15   Earnstine Regal, PA-C  levofloxacin (LEVAQUIN)  500 MG tablet Take 1 tablet (500 mg total) by mouth daily. 10/31/15 11/10/15  Drenda Freeze, MD  metoprolol (LOPRESSOR) 100 MG tablet Take 1 tablet (100 mg total) by mouth 2 (two) times daily. 10/12/15   Earnstine Regal, PA-C    Family History Family History  Problem Relation Age of Onset  . Hypertension Mother   . Arrhythmia Mother   . Hypertension Father   . Arrhythmia Father     Social History Social History  Substance Use Topics  . Smoking status: Never Smoker  . Smokeless tobacco: Not on file  . Alcohol use 12.6 oz/week    21 Shots of liquor per week     Allergies   Review of patient's allergies indicates no known allergies.   Review of Systems Review of Systems  Respiratory: Positive for shortness of breath.   All other systems reviewed and are negative.    Physical Exam Updated Vital Signs BP (!) 155/77   Pulse (!) 129   Temp 98.3 F (36.8 C) (Oral)   Resp (!) 24   Ht 5' (1.524 m)   Wt 93 lb (42.2 kg)   SpO2 (!) 89%   BMI 18.16 kg/m   Physical Exam  Constitutional: She is oriented to person, place, and time.  Chronically ill, tachypneic   HENT:  Head: Normocephalic.  Eyes: EOM are normal. Pupils are equal,  round, and reactive to light.  Neck: Normal range of motion.  Cardiovascular:  Tachycardic   Pulmonary/Chest:  Diminished L base, tachypneic. Chest tube scar healing well.   Abdominal: Soft. Bowel sounds are normal.  Musculoskeletal: Normal range of motion. She exhibits no edema.  Neurological: She is alert and oriented to person, place, and time.  Skin: Skin is warm.  Psychiatric: She has a normal mood and affect.  Nursing note and vitals reviewed.    ED Treatments / Results  Labs (all labs ordered are listed, but only abnormal results are displayed) Labs Reviewed  BASIC METABOLIC PANEL - Abnormal; Notable for the following:       Result Value   Potassium 3.0 (*)    Chloride 99 (*)    CO2 34 (*)    Glucose, Bld 138 (*)    Calcium  8.6 (*)    All other components within normal limits  CBC - Abnormal; Notable for the following:    RBC 3.74 (*)    All other components within normal limits  TROPONIN I - Abnormal; Notable for the following:    Troponin I 0.03 (*)    All other components within normal limits  ETHANOL    EKG  EKG Interpretation None      ED ECG REPORT I, Adonnis Salceda, the attending physician, personally viewed and interpreted this ECG.   Date: 10/31/2015  EKG Time: 15:35 pm  Rate: 123  Rhythm: sinus tachycardia  Axis: normal  Intervals:none  ST&T Change: nonspecific   Radiology Ct Angio Chest Pe W And/or Wo Contrast  Result Date: 10/31/2015 CLINICAL DATA:  Labored breathing. Recent fall with rib fractures. Shortness of breath. EXAM: CT ANGIOGRAPHY CHEST WITH CONTRAST TECHNIQUE: Multidetector CT imaging of the chest was performed using the standard protocol during bolus administration of intravenous contrast. Multiplanar CT image reconstructions and MIPs were obtained to evaluate the vascular anatomy. CONTRAST:  100 cc Isovue 370 IV COMPARISON:  10/09/2015.  Chest CT 10/03/2015. FINDINGS: Cardiovascular: No filling defects in the pulmonary arteries to suggest pulmonary emboli. Heart is normal size. Coronary artery and aortic calcifications. No evidence of aortic aneurysm or dissection. Mediastinum/Nodes: No mediastinal, hilar, or axillary adenopathy. Lungs/Pleura: Previously seen left pneumothorax has resolved. There is a large left pleural effusion, increasing since prior CT. Compressive atelectasis in the lingula and left lower lobe. Multiple left-sided rib fractures are again noted. Small right pleural effusion, new since prior study. No confluent opacity on the right. Upper Abdomen: Imaging into the upper abdomen shows no acute findings. Musculoskeletal: Chest wall soft tissues are unremarkable. Multiple left rib fractures are again noted. Stable mild compression fractures in the lower thoracic spine  and upper lumbar spine. No acute bony abnormality. Review of the MIP images confirms the above findings. IMPRESSION: Large left pleural effusion and small right pleural effusion, increased since prior study. Compressive atelectasis in the lingula and left lower lobe. Left pneumothorax seen on prior CT has resolved. No evidence of pulmonary embolus. Coronary artery disease, aortic atherosclerosis. Electronically Signed   By: Rolm Baptise M.D.   On: 10/31/2015 17:39   Procedures Procedures (including critical care time)  Medications Ordered in ED Medications  potassium chloride SA (K-DUR,KLOR-CON) CR tablet 40 mEq (40 mEq Oral Refused 10/31/15 1653)  levofloxacin (LEVAQUIN) tablet 500 mg (500 mg Oral Given 10/31/15 1825)  sodium chloride 0.9 % bolus 1,000 mL (0 mLs Intravenous Stopped 10/31/15 1820)  ipratropium-albuterol (DUONEB) 0.5-2.5 (3) MG/3ML nebulizer solution 3 mL (3 mLs Nebulization  Given 10/31/15 1631)  iopamidol (ISOVUE-370) 76 % injection 100 mL (100 mLs Intravenous Contrast Given 10/31/15 1719)     Initial Impression / Assessment and Plan / ED Course  I have reviewed the triage vital signs and the nursing notes.  Pertinent labs & imaging results that were available during my care of the patient were reviewed by me and considered in my medical decision making (see chart for details).  Clinical Course    Lisa Chan is a 82 y.o. female here with shortness of breath, hypoxia. I am concerned for possible PE vs pneumonia causing hypoxia. Patient and family very adamant that they just want to get oxygen set up and doesn't want to be admitted. I told them that we should get labs, CT angio chest. Will give duoneb and reassess.   4:30 pm I called Cheryl from case management regarding setting up for home oxygen. She doesn't qualify unless she gets admitted. But family will to pay out of pocket. Oxygen 87-89% on RA.   7 pm Case management saw patient and set up for home oxygen. [facility] to go to home to set it up. CT showed large L pleural effusion, no obvious pneumonia. Offered admission again for possible thoracentesis and abx but refused. Give levaquin empirically. Has follow up with CT surgery and trauma surgery and recommend that they follow back up with these specialists. Trop 0.03 likely demand ischemia and has no chest pain or EKG changes. K 3.0 but refused to take potassium. Both patient and daughter wants to go home. [facility] to meet them at home.     Final Clinical Impressions(s) / ED Diagnoses   Final diagnoses:  Hypoxia  Pleural effusion    New Prescriptions Discharge Medication List as of 10/31/2015  6:54 PM    START taking these medications   Details  levofloxacin (LEVAQUIN) 500 MG tablet Take 1 tablet (500 mg total) by mouth daily., Starting Fri 10/31/2015, Until Mon 11/10/2015, Print         Drenda Freeze, MD 10/31/15 2059

## 2015-10-31 NOTE — Discharge Instructions (Signed)
Please take levaquin daily for a week.   Use oxygen as needed.  See your doctor  Return to ER if you have worse shortness of breath, chest pain, fevers, cough.

## 2015-10-31 NOTE — Progress Notes (Signed)
Pre visit review using our clinic review tool, if applicable. No additional management support is needed unless otherwise documented below in the visit note. 

## 2015-10-31 NOTE — Patient Instructions (Signed)
Nice to meet you. Please go to the emergency room as soon as possible. We will call them and let them know that you're on her way.

## 2015-10-31 NOTE — Care Management Note (Signed)
Case Management Note  Patient Details  Name: Lisa Chan MRN: [id number] Date of Birth: [birth year]  Subjective/Objective:    Called by Dr Darl Householder to arrange home 02 for pt. She understands there is a $250 charge that will not be covered [facility] because she is not IP in the hospital. Charges were verified by me with Chastity at [facility]. The demographics and info for the pt. Were faxed at 5:45pm and the patient was to d/c. Apparently in checking, the pt. Has not left the ER and they have been trying to deliver the 02. The daughter was talking on her phone and did not pick up the call. We are trying to get the pt. Out of the ER now.  Chastity and Corene Cornea helped to set this up.                Action/Plan:   Expected Discharge Date:                  Expected Discharge Plan:     In-House Referral:     Discharge planning Services     Post Acute Care Choice:    Choice offered to:     DME Arranged:    DME Agency:     HH Arranged:    HH Agency:     Status of Service:     If discussed at [employer] of Stay Meetings, dates discussed:    Additional Comments:  Beau Fanny, RN 10/31/2015, 6:49 PM

## 2015-10-31 NOTE — Progress Notes (Signed)
Tommi Rumps, MD Phone: [phone]  Lisa Chan is a 82 y.o. female who presents today for new patient visit.  Patient notes she fractured several ribs on the left side earlier this month. She was admitted to the hospital for displaced rib fractures and pneumothorax/hemothorax. She had a chest tube placed. She has been short of breath since then. Shallow breathing. Oxygen down into the 70s and 80s persistently. No pain. No cough. She's not taking any pain medications. Daughter reports her breathing has been labored. Reports that she followed up with the surgeon about 2 weeks ago and they released her. Saw cardiologist nurse practitioner last week for follow-up on her heart rate and follow-up lab work. Patient's daughter notes that the patient was advised to go to the emergency room if her oxygen dropped further than the upper 80s or if she became more short of breath, though they have not gone to the emergency room. There is some thought that maybe she had been in A. fib in the hospital although this was felt to not be accurate. At follow-up with her cardiologist they left her on metoprolol and diltiazem and said they would not place her on anticoagulation given her fall risk.  Active Ambulatory Problems    Diagnosis Date Noted  . Multiple rib fractures 10/04/2015  . Fall 10/08/2015  . Acute blood loss anemia 10/08/2015  . Mass of skin of right shoulder 10/08/2015  . Alcohol abuse 10/08/2015  . Traumatic hemopneumothorax 10/08/2015  . Arrhythmia 10/10/2015  . Tachycardia 10/11/2015  . Premature atrial contractions 10/11/2015   Resolved Ambulatory Problems    Diagnosis Date Noted  . No Resolved Ambulatory Problems   Past Medical History:  Diagnosis Date  . Alcohol abuse   . Arthritis   . Fall   . Rib fracture     Family History  Problem Relation Age of Onset  . Hypertension Mother   . Arrhythmia Mother   . Hypertension Father   . Arrhythmia Father     Social History    Social History  . Marital status: Married    Spouse name: N/A  . Number of children: N/A  . Years of education: N/A   Occupational History  . Not on file.   Social History Main Topics  . Smoking status: Never Smoker  . Smokeless tobacco: Not on file  . Alcohol use 12.6 oz/week    21 Shots of liquor per week  . Drug use: Unknown  . Sexual activity: Not on file   Other Topics Concern  . Not on file   Social History Narrative  . No narrative on file    ROS  General:  Negative for nexplained weight loss, fever Skin: Negative for new or changing mole, sore that won't heal HEENT: Negative for trouble hearing, trouble seeing, ringing in ears, mouth sores, hoarseness, change in voice, dysphagia. CV:  Negative for chest pain, dyspnea, edema, palpitations Resp: Negative for cough, dyspnea, hemoptysis GI: Negative for nausea, vomiting, diarrhea, constipation, abdominal pain, melena, hematochezia. GU: Negative for dysuria, incontinence, urinary hesitance, hematuria, vaginal or penile discharge, polyuria, sexual difficulty, lumps in testicle or breasts MSK: Negative for muscle cramps or aches, joint pain or swelling Neuro: Negative for headaches, weakness, numbness, dizziness, passing out/fainting Psych: Negative for depression, anxiety, memory problems  Objective  Physical Exam Vitals:   10/31/15 1331  BP: 128/86  Pulse: (!) 56  Temp: 98.2 F (36.8 C)  Ambulatory O2 sat 88%, prior to getting in the room her  oxygenation was in the 70s  BP Readings from Last 3 Encounters:  10/31/15 128/86  10/24/15 110/64  10/12/15 (!) 159/76   Wt Readings from Last 3 Encounters:  10/31/15 98 lb (44.5 kg)  10/24/15 97 lb (44 kg)  10/04/15 99 lb 3.3 oz (45 kg)    Physical Exam  HENT:  Head: Normocephalic and atraumatic.  Cardiovascular:  Irregularly irregular, no murmurs  Pulmonary/Chest:  Some increased effort with breathing, breath sounds present on the left side and right side  though decreased breath sounds on the left with crackles in the left lower lung field  Musculoskeletal: She exhibits no edema.  Neurological: She is alert. Gait normal.  Skin: Skin is warm and dry. She is not diaphoretic.  Psychiatric: Mood and affect normal.     Assessment/Plan:   Multiple rib fractures Patient with multiple rib fractures status post chest tube for pneumothorax and hemothorax. She has continued to be dyspneic since this occurred. Oxygen saturation not higher than 88% today. Down into the 70s on occasion today in the office. Visibly dyspneic at times. Was down into the 70s yesterday per her daughter's report. Discussed this with patient. Heart rate is irregular and sounds tachycardic on exam. EKG was declined. Given constellation of symptoms and prior rib fractures with low oxygenation I have advised the patient that she needs evaluation in the emergency room for chest x-ray and repeat lab work. I discussed EMS transport though the patient declined. Patient was quite hesitant to seek further evaluation as her and her daughter were both under the impression that they would just come to the doctor's office and get oxygen. Discussed that she needs further evaluation to determine if there is any worsening underlying cause of this. Advised that there may be social workers at the hospital that can work on getting her home oxygen if the ED physician feels she is in need of oxygen and is stable to go home. Patient and her daughter both said that they would go to the emergency room for further evaluation. CMA called the ED and advised them that the patient was on her way.   Orders Placed This Encounter  Procedures  . EKG 12-Lead    No orders of the defined types were placed in this encounter.    Tommi Rumps, MD [place]

## 2015-10-31 NOTE — Assessment & Plan Note (Addendum)
Patient with multiple rib fractures status post chest tube for pneumothorax and hemothorax. She has continued to be dyspneic since this occurred. Oxygen saturation not higher than 88% today. Down into the 70s on occasion today in the office. Visibly dyspneic at times. Was down into the 70s yesterday per her daughter's report. Discussed this with patient. Heart rate is irregular and sounds tachycardic on exam. EKG was declined. Given constellation of symptoms and prior rib fractures with low oxygenation I have advised the patient that she needs evaluation in the emergency room for chest x-ray and repeat lab work. I discussed EMS transport though the patient declined. Patient was quite hesitant to seek further evaluation as her and her daughter were both under the impression that they would just come to the doctor's office and get oxygen. Discussed that she needs further evaluation to determine if there is any worsening underlying cause of this. Advised that there may be social workers at the hospital that can work on getting her home oxygen if the ED physician feels she is in need of oxygen and is stable to go home. Patient and her daughter both said that they would go to the emergency room for further evaluation. CMA called the ED and advised them that the patient was on her way.

## 2015-10-31 NOTE — ED Notes (Signed)
Pt transported to CT ?

## 2015-10-31 NOTE — ED Notes (Signed)
MD Darl Householder aware of HR and SPO2. Patient and MD request DC

## 2015-10-31 NOTE — ED Notes (Signed)
Pt refused potassium. Reports has hard time swallowing pills, offered to crush pills. Pt still refused, reports aware potassium is low. Reports recently started on potassium rich diet by pcp. MD notified.

## 2015-11-04 DIAGNOSIS — S2242XD Multiple fractures of ribs, left side, subsequent encounter for fracture with routine healing: Secondary | ICD-10-CM | POA: Diagnosis not present

## 2015-11-04 DIAGNOSIS — Z9181 History of falling: Secondary | ICD-10-CM | POA: Diagnosis not present

## 2015-11-04 DIAGNOSIS — F1010 Alcohol abuse, uncomplicated: Secondary | ICD-10-CM | POA: Diagnosis not present

## 2015-11-04 DIAGNOSIS — S272XXD Traumatic hemopneumothorax, subsequent encounter: Secondary | ICD-10-CM | POA: Diagnosis not present

## 2015-11-04 DIAGNOSIS — W19XXXD Unspecified fall, subsequent encounter: Secondary | ICD-10-CM | POA: Diagnosis not present

## 2015-11-04 DIAGNOSIS — S41101D Unspecified open wound of right upper arm, subsequent encounter: Secondary | ICD-10-CM | POA: Diagnosis not present

## 2015-11-04 DIAGNOSIS — M1990 Unspecified osteoarthritis, unspecified site: Secondary | ICD-10-CM | POA: Diagnosis not present

## 2015-11-06 ENCOUNTER — Telehealth: Payer: Self-pay | Admitting: Family Medicine

## 2015-11-06 NOTE — Telephone Encounter (Signed)
I have not ordered therapy or home health for the patient. The first time I saw her was last week and I sent her to the ED for evaluation. If they need something ordered they need to send paperwork for me to sign and evaluate. Thanks.

## 2015-11-06 NOTE — Telephone Encounter (Signed)
Lisa Chan from [facility]. Calling for a verbal order for patient. Call her at [phone]. 2 w 2 and 1 w 1. To complete the plan of care.

## 2015-11-06 NOTE — Telephone Encounter (Signed)
I don't see where we have ordered therapy/HOme health for her? Please advise?

## 2015-11-06 NOTE — Telephone Encounter (Signed)
[name] needs to have paperwork sent over since Dr. Caryl Bis has not ordered therapy prior for this patient, thanks

## 2015-11-07 NOTE — Telephone Encounter (Signed)
Left a message for Lisa Chan from [facility] to call the office . The physician is needing paperwork from them to do a plan of care.

## 2015-11-10 DIAGNOSIS — Z9181 History of falling: Secondary | ICD-10-CM | POA: Diagnosis not present

## 2015-11-10 DIAGNOSIS — F1010 Alcohol abuse, uncomplicated: Secondary | ICD-10-CM | POA: Diagnosis not present

## 2015-11-10 DIAGNOSIS — S2242XD Multiple fractures of ribs, left side, subsequent encounter for fracture with routine healing: Secondary | ICD-10-CM | POA: Diagnosis not present

## 2015-11-10 DIAGNOSIS — W19XXXD Unspecified fall, subsequent encounter: Secondary | ICD-10-CM | POA: Diagnosis not present

## 2015-11-10 DIAGNOSIS — S272XXD Traumatic hemopneumothorax, subsequent encounter: Secondary | ICD-10-CM | POA: Diagnosis not present

## 2015-11-10 DIAGNOSIS — M1990 Unspecified osteoarthritis, unspecified site: Secondary | ICD-10-CM | POA: Diagnosis not present

## 2015-11-10 DIAGNOSIS — S41101D Unspecified open wound of right upper arm, subsequent encounter: Secondary | ICD-10-CM | POA: Diagnosis not present

## 2015-11-12 ENCOUNTER — Telehealth: Payer: Self-pay | Admitting: *Deleted

## 2015-11-12 NOTE — Telephone Encounter (Signed)
Langley Gauss of [facility] stated that the family requested to have palliative care in the home  [place] [phone]

## 2015-11-14 ENCOUNTER — Telehealth: Payer: Self-pay

## 2015-11-14 ENCOUNTER — Telehealth: Payer: Self-pay | Admitting: Family Medicine

## 2015-11-14 DIAGNOSIS — S272XXD Traumatic hemopneumothorax, subsequent encounter: Secondary | ICD-10-CM | POA: Diagnosis not present

## 2015-11-14 DIAGNOSIS — F1010 Alcohol abuse, uncomplicated: Secondary | ICD-10-CM | POA: Diagnosis not present

## 2015-11-14 DIAGNOSIS — S2242XD Multiple fractures of ribs, left side, subsequent encounter for fracture with routine healing: Secondary | ICD-10-CM | POA: Diagnosis not present

## 2015-11-14 DIAGNOSIS — W19XXXD Unspecified fall, subsequent encounter: Secondary | ICD-10-CM | POA: Diagnosis not present

## 2015-11-14 DIAGNOSIS — S41101D Unspecified open wound of right upper arm, subsequent encounter: Secondary | ICD-10-CM | POA: Diagnosis not present

## 2015-11-14 DIAGNOSIS — M1990 Unspecified osteoarthritis, unspecified site: Secondary | ICD-10-CM | POA: Diagnosis not present

## 2015-11-14 DIAGNOSIS — Z9181 History of falling: Secondary | ICD-10-CM | POA: Diagnosis not present

## 2015-11-14 NOTE — Telephone Encounter (Signed)
Noted, thanks!

## 2015-11-14 NOTE — Telephone Encounter (Signed)
Lisa Chan [id number] called from [facility] regarding form that was faxed for Palliative care on Wednesday 08/09. She wants to know if you have received it? Fax demographics with insurance information.

## 2015-11-14 NOTE — Telephone Encounter (Signed)
Please assist, thanks

## 2015-11-14 NOTE — Telephone Encounter (Signed)
Can you let me know if this is in Dr. Ellen Henri mailbox in the POD? Thanks

## 2015-11-14 NOTE — Telephone Encounter (Signed)
Yes the fax is on his desk, I gave it to him yesterday-Brenen Beigel, RMA

## 2015-11-14 NOTE — Telephone Encounter (Signed)
Done-aa 

## 2015-11-14 NOTE — Telephone Encounter (Signed)
Lisa Chan [id number] called from [facility] stating that they need pt demographics. Last office note with insurance information. Please and thank you!

## 2015-11-17 ENCOUNTER — Other Ambulatory Visit: Payer: Self-pay | Admitting: *Deleted

## 2015-11-17 MED ORDER — METOPROLOL TARTRATE 100 MG PO TABS: 100.0000 mg | ORAL_TABLET | Freq: Two times a day (BID) | ORAL | 11 refills | Status: DC

## 2015-11-17 MED ORDER — DILTIAZEM HCL ER COATED BEADS 180 MG PO CP24: 180.0000 mg | ORAL_CAPSULE | Freq: Every day | ORAL | 11 refills | Status: DC

## 2015-11-17 MED ORDER — METOPROLOL TARTRATE 100 MG PO TABS
100.0000 mg | ORAL_TABLET | Freq: Two times a day (BID) | ORAL | 11 refills | Status: DC
Start: 2015-11-17 — End: 2017-04-23

## 2015-11-17 NOTE — Telephone Encounter (Signed)
Burtis Junes, NP  to Tamsen Snider     3:03 PM  Note    I have no problem to refill these.      Will call daughter and inform her that Cecille Rubin will fill.

## 2015-11-17 NOTE — Addendum Note (Signed)
Addended by: Roberts Gaudy on: 11/17/2015 03:39 PM   Modules accepted: Orders

## 2015-11-17 NOTE — Telephone Encounter (Signed)
I have no problem to refill these.

## 2015-11-17 NOTE — Telephone Encounter (Signed)
Palliative care form signed 11/14/15. Handed to CMA to fax.

## 2015-11-17 NOTE — Telephone Encounter (Signed)
error 

## 2015-11-17 NOTE — Telephone Encounter (Signed)
Pt's daughter called wanting to know if Lisa Chan will fill these medications Metoprolol & Cardizem or if she should ask the PCP. These were filled in the ER but need someone to take them on. Please advise.

## 2015-11-21 DIAGNOSIS — W19XXXD Unspecified fall, subsequent encounter: Secondary | ICD-10-CM | POA: Diagnosis not present

## 2015-11-21 DIAGNOSIS — S2242XD Multiple fractures of ribs, left side, subsequent encounter for fracture with routine healing: Secondary | ICD-10-CM | POA: Diagnosis not present

## 2015-11-21 DIAGNOSIS — F1010 Alcohol abuse, uncomplicated: Secondary | ICD-10-CM | POA: Diagnosis not present

## 2015-11-21 DIAGNOSIS — M1990 Unspecified osteoarthritis, unspecified site: Secondary | ICD-10-CM | POA: Diagnosis not present

## 2015-11-21 DIAGNOSIS — S41101D Unspecified open wound of right upper arm, subsequent encounter: Secondary | ICD-10-CM | POA: Diagnosis not present

## 2015-11-21 DIAGNOSIS — S272XXD Traumatic hemopneumothorax, subsequent encounter: Secondary | ICD-10-CM | POA: Diagnosis not present

## 2015-11-21 DIAGNOSIS — Z9181 History of falling: Secondary | ICD-10-CM | POA: Diagnosis not present

## 2015-11-26 ENCOUNTER — Telehealth: Payer: Self-pay | Admitting: Family Medicine

## 2015-11-26 NOTE — Telephone Encounter (Signed)
Pt daughter called about pt needing a Rx for oxygen. Pt was given Oxygen in the ER.   Call pt daughter @ [phone]. Thank you!

## 2015-11-26 NOTE — Telephone Encounter (Signed)
Dr. Caryl Bis sent patient to ED to receive oxygen per ED note [phone]. Patients family would like an RX for Oxygen.  Patient is at Lisa Chan per note.  Please advise.

## 2015-11-26 NOTE — Telephone Encounter (Signed)
LM for patients daughter to return call. Will need to schedule appointment with Dr. Caryl Bis to discuss oxygen for patient.

## 2015-11-26 NOTE — Telephone Encounter (Signed)
Please call daughter, to discuss the matter of the prescription .  The daughter requested to have a call at [phone], and to have mother chart reviewed to see her need for oxygen

## 2015-11-26 NOTE — Telephone Encounter (Signed)
Patient has been seen since ED visit and has not met criteria for oxygen to my knowledge. Needs to see [place].

## 2015-11-26 NOTE — Telephone Encounter (Signed)
Patients daughter will be calling in the Morning to schedule an appointment with Dr. Caryl Bis as soon as possible per patients daughter.  She would like the appointment only to discuss and receive oxygen.

## 2015-11-27 ENCOUNTER — Ambulatory Visit (INDEPENDENT_AMBULATORY_CARE_PROVIDER_SITE_OTHER): Payer: Medicare PPO | Admitting: Family Medicine

## 2015-11-27 DIAGNOSIS — J90 Pleural effusion, not elsewhere classified: Secondary | ICD-10-CM

## 2015-11-27 DIAGNOSIS — J9611 Chronic respiratory failure with hypoxia: Secondary | ICD-10-CM | POA: Diagnosis not present

## 2015-11-27 DIAGNOSIS — I959 Hypotension, unspecified: Secondary | ICD-10-CM | POA: Diagnosis not present

## 2015-11-27 NOTE — Telephone Encounter (Signed)
Patients daughter has scheduled appointment for today with Dr. Lacinda Axon.

## 2015-11-27 NOTE — Progress Notes (Signed)
Pre visit review using our clinic review tool, if applicable. No additional management support is needed unless otherwise documented below in the visit note. 

## 2015-11-27 NOTE — Progress Notes (Signed)
Subjective:  Patient ID: Lisa Chan, female    DOB: 1927-01-25  Age: 82 y.o. MRN: [id number]  CC: Oxygen therapy  HPI:  82 year old female with recent fall in July with subsequent rib fractures and pneumothorax & hemothorax who presents to get documentation to support chronic oxygen therapy.  Following hospitalization in early July, patient has developed chronic respiratory failure requiring oxygen. She was recently seen in our office on 7/28. At that time she was hypoxic, and was sent to the ED for evaluation. In the emergency department, patient was again found to be hypoxic. CT angio was obtained and revealed a large left pleural effusion. Patient and family did not want to be admitted. She was placed on antibiotics and case management set up home oxygen for the patient. However, this was done at the expense of the family as insurance did not approve her to have home oxygen at that time.  Patient presents today with her daughter requesting documentation to support oxygen therapy so the insurance will pay. Patient states that she is doing fine. She is short of breath particularly with exertion/ambulation. She is on 2 L of oxygen via nasal cannula at home. She has no complaints today. She is not happy about being in the office. In regards to her pleural effusion, there has been no further workup or treatment. We'll discuss this with the daughter and patient today. No recent fevers or chills. No reports of chest pain. No other complaints at this time.  Social Hx   Social History   Social History  . Marital status: Married    Spouse name: N/A  . Number of children: N/A  . Years of education: N/A   Social History Main Topics  . Smoking status: Never Smoker  . Smokeless tobacco: Not on file  . Alcohol use 12.6 oz/week    21 Shots of liquor per week  . Drug use: Unknown  . Sexual activity: Not on file   Other Topics Concern  . Not on file   Social History Narrative  . No narrative  on file   Review of Systems  Respiratory: Positive for shortness of breath.   Cardiovascular: Negative.    Objective:  BP 94/66   Pulse 83   Temp 98.3 F (36.8 C) (Oral)   Wt 89 lb 3.2 oz (40.5 kg)   SpO2 (!) 87%   BMI 17.42 kg/m   BP/Weight 11/27/2015 10/31/2015 [id number]  Systolic BP 94 99991111 0000000  Diastolic BP 66 77 86  Wt. (Lbs) 89.2 93 98  BMI 17.42 18.16 18.52   Physical Exam  Constitutional:  Thin, frail elderly female in no acute distress. Appears underweight. Nasal cannula oxygen in place.  Cardiovascular: Normal rate and regular rhythm.   Pulmonary/Chest:  Decreased breath sounds throughout.  Neurological: She is alert.  Psychiatric:  Flat affect.  Vitals reviewed.  Lab Results  Component Value Date   WBC 4.0 10/31/2015   HGB 12.7 10/31/2015   HCT 37.2 10/31/2015   PLT 242 10/31/2015   GLUCOSE 138 (H) 10/31/2015   ALT 37 02/20/2013   AST 53 (H) 02/20/2013   NA 140 10/31/2015   K 3.0 (L) 10/31/2015   CL 99 (L) 10/31/2015   CREATININE 0.57 10/31/2015   BUN 9 10/31/2015   CO2 34 (H) 10/31/2015   TSH 2.46 10/24/2015   Assessment & Plan:   Problem List Items Addressed This Visit    Chronic respiratory failure ([place])    New problem.  Patient meets criteria for chronic respiratory failure. Her oxygen saturation today on room air was 87%. Dropped even further below this with exertion. Improved to greater than 90% with nasal cannula O2 (2L). Patient has had another documented encounters (in the ED and our office) of hypoxia. Patient is in need of oxygen therapy at all times (at rest and with exertion).        Pleural effusion    New problem. Discussed this with patient and daughter today. She has completed her antibiotic course. I recommended repeating CT scan to reevaluate. Daughter is unsure if they will intervene given her advanced age. We'll discuss with PCP and have patient follow-up. This is definitely impacting her shortness of breath/respiratory  failure.      Hypotension    No problem. BP low today. Advised to stop diltiazem.       Other Visit Diagnoses   None.    Follow-up: To be determined after discussion with PCP  [place]

## 2015-11-28 DIAGNOSIS — J9610 Chronic respiratory failure, unspecified whether with hypoxia or hypercapnia: Secondary | ICD-10-CM | POA: Insufficient documentation

## 2015-11-28 DIAGNOSIS — I959 Hypotension, unspecified: Secondary | ICD-10-CM | POA: Insufficient documentation

## 2015-11-28 DIAGNOSIS — J90 Pleural effusion, not elsewhere classified: Secondary | ICD-10-CM | POA: Insufficient documentation

## 2015-11-28 NOTE — Assessment & Plan Note (Signed)
New problem. Discussed this with patient and daughter today. She has completed her antibiotic course. I recommended repeating CT scan to reevaluate. Daughter is unsure if they will intervene given her advanced age. We'll discuss with PCP and have patient follow-up. This is definitely impacting her shortness of breath/respiratory failure.

## 2015-11-28 NOTE — Assessment & Plan Note (Addendum)
New problem. Patient meets criteria for chronic respiratory failure. Her oxygen saturation today on room air was 87% (at rest). Dropped further below this standing and exertion.. Improved to greater than 90% with nasal cannula O2 (2L). Patient has had another documented encounters (in the ED and our office) of hypoxia. Patient is in need of oxygen therapy at all times (at rest and with exertion). She has tried other alternatives, nebulizer/inhalers and is in need of continuous oxygen.

## 2015-11-28 NOTE — Assessment & Plan Note (Addendum)
No problem. BP low today. Advised to stop diltiazem.

## 2015-12-01 ENCOUNTER — Telehealth: Payer: Self-pay | Admitting: *Deleted

## 2015-12-01 NOTE — Telephone Encounter (Signed)
I will forward to Dr Lacinda Axon who saw the patient for this while I was out of the office.

## 2015-12-01 NOTE — Telephone Encounter (Signed)
Mel[facility] from [facility] has requested more documentation for the Oxygen order. She requested to have the provider NPI, oxygen saturation on room air-resting  And previous method used before the O2.  Fax [phone] Molli Posey

## 2015-12-01 NOTE — Telephone Encounter (Signed)
I faxed this personally.

## 2015-12-01 NOTE — Telephone Encounter (Signed)
Please advise 

## 2015-12-01 NOTE — Telephone Encounter (Signed)
Noted. Thanks was the below information on the original fax?

## 2015-12-01 NOTE — Telephone Encounter (Signed)
Patient was seen on the 24th by Lacinda Axon, I see that he wrote in note regarding need for O2, is there a prescription that was done for it? Thanks

## 2015-12-04 ENCOUNTER — Encounter (HOSPITAL_COMMUNITY): Payer: Self-pay | Admitting: Emergency Medicine

## 2015-12-04 ENCOUNTER — Encounter (HOSPITAL_COMMUNITY)
Admission: RE | Admit: 2015-12-04 | Discharge: 2015-12-04 | Disposition: A | Discharge status: Home / Self Care | Payer: Medicare PPO | Source: Ambulatory Visit | Attending: Anesthesiology | Admitting: Anesthesiology

## 2015-12-04 ENCOUNTER — Encounter (HOSPITAL_COMMUNITY): Payer: Self-pay

## 2015-12-04 ENCOUNTER — Encounter (HOSPITAL_COMMUNITY)
Admission: RE | Admit: 2015-12-04 | Discharge: 2015-12-04 | Disposition: A | Discharge status: Home / Self Care | Payer: Medicare PPO | Source: Ambulatory Visit | Attending: General Surgery | Admitting: General Surgery

## 2015-12-04 DIAGNOSIS — Z79899 Other long term (current) drug therapy: Secondary | ICD-10-CM | POA: Diagnosis not present

## 2015-12-04 DIAGNOSIS — J90 Pleural effusion, not elsewhere classified: Secondary | ICD-10-CM | POA: Diagnosis not present

## 2015-12-04 DIAGNOSIS — R2231 Localized swelling, mass and lump, right upper limb: Secondary | ICD-10-CM | POA: Insufficient documentation

## 2015-12-04 DIAGNOSIS — Z01818 Encounter for other preprocedural examination: Secondary | ICD-10-CM | POA: Insufficient documentation

## 2015-12-04 DIAGNOSIS — Z01811 Encounter for preprocedural respiratory examination: Secondary | ICD-10-CM

## 2015-12-04 DIAGNOSIS — J449 Chronic obstructive pulmonary disease, unspecified: Secondary | ICD-10-CM | POA: Diagnosis not present

## 2015-12-04 DIAGNOSIS — Z01812 Encounter for preprocedural laboratory examination: Secondary | ICD-10-CM | POA: Insufficient documentation

## 2015-12-04 HISTORY — DX: Reserved for inherently not codable concepts without codable children: IMO0001

## 2015-12-04 LAB — CBC
HCT: 34.8 % — ABNORMAL LOW (ref 36.0–46.0)
Hemoglobin: 11.3 g/dL — ABNORMAL LOW (ref 12.0–15.0)
MCH: 31.6 pg (ref 26.0–34.0)
MCHC: 32.5 g/dL (ref 30.0–36.0)
MCV: 97.2 fL (ref 78.0–100.0)
PLATELETS: 296 10*3/uL (ref 150–400)
RBC: 3.58 MIL/uL — ABNORMAL LOW (ref 3.87–5.11)
RDW: 13.3 % (ref 11.5–15.5)
WBC: 5.3 10*3/uL (ref 4.0–10.5)

## 2015-12-04 LAB — COMPREHENSIVE METABOLIC PANEL
ALBUMIN: 2.6 g/dL — AB (ref 3.5–5.0)
ALT: 9 U/L — AB (ref 14–54)
AST: 20 U/L (ref 15–41)
Alkaline Phosphatase: 65 U/L (ref 38–126)
Anion gap: 11 (ref 5–15)
BUN: <5 mg/dL — ABNORMAL LOW (ref 6–20)
CALCIUM: 8.3 mg/dL — AB (ref 8.9–10.3)
CO2: 28 mmol/L (ref 22–32)
CREATININE: 0.79 mg/dL (ref 0.44–1.00)
Chloride: 101 mmol/L (ref 101–111)
GFR calc Af Amer: >60 mL/min (ref >60)
GFR calc non Af Amer: >60 mL/min (ref >60)
GLUCOSE: 97 mg/dL (ref 65–99)
Potassium: 3 mmol/L — ABNORMAL LOW (ref 3.5–5.1)
SODIUM: 140 mmol/L (ref 135–145)
Total Bilirubin: 0.8 mg/dL (ref 0.3–1.2)
Total Protein: 5.9 g/dL — ABNORMAL LOW (ref 6.5–8.1)

## 2015-12-04 NOTE — Progress Notes (Signed)
Daughter at bedside requesting Chaplain services be called to assist with advance directives. Message left with date and time of surgery.  PCP is Dr Tommi Rumps Cardiologist is Truitt Merle Denies ever having a card cath, stress test, or echo  Pt was admitted In July after a fall , with Rib fracture and Pneumothorax, was d/c  From hospital July 9th Ed visit on 10-31-15  On Home o2 2 liters at all times

## 2015-12-04 NOTE — Progress Notes (Addendum)
Anesthesia Chart Review:  Pt is an 82 year old female scheduled for excision of mass from skin of R shoulder on 12/11/2015 with Georganna Skeans, MD.   Recently began receiving primary care at [facility]  PMH includes:  Alcohol abuse. Uses 2L 02 at all times starting 10/31/15. Never smoker. BMI 17.5  Pt hospitalized 7/1-10/12/15 for mechanical fall resulting in displaced L 2-8 displaced rib fractures, hemothorax, pneumothorax. Pt also experienced tachycardia during hospital stay and started on diltiazem and metoprolol for frequent PACs, sinus tachycardia and possible MAT.   Pt saw Truitt Merle, NP with cardiology for follow up of tachycardia during hospitalization on 10/24/15. No change to medication. Afib not suspected, but would not be a candidate for anticoagulation if she did have afib.   Pt saw Thersa Salt, DO at [facility] office 11/28/15. Diltiazem stopped for hypotension.  Dr. Lacinda Axon recommended repeating CT of chest put pt/family declined.   BP 127/66   Pulse 83   Temp 36.5 C (Oral)   Resp 20   SpO2 100%   Pt is using 2L O2 at all times.   Medications include: metoprolol.   Preoperative labs reviewed.    CXR 12/04/15:  1. COPD.  2. Large left pleural effusion occupying 1/2 of the pleural space volume which has increased in volume since the CT scan of October 31, 2015. No significant right pleural effusion. No pneumothorax. 3. Multiple displaced left rib fractures.  4. Bilateral fractures of the humeral necks.  5. Distal right clavicular fracture. 6. Aortic atherosclerosis. - I notified April in Dr. Biagio Borg office of worsening L pleural effusion.   CT angio chest 10/31/15:  1. Large left pleural effusion and small right pleural effusion, increased since prior study. Compressive atelectasis in the lingula and left lower lobe. 2. Left pneumothorax seen on prior CT has resolved. 3. No evidence of pulmonary embolus. 4. Coronary artery disease, aortic  atherosclerosis.  EKG 10/31/15: Sinus tachycardia (133 bpm) with PACs. Left axis deviation. Inferior infarct (cited on or before 20-Feb-2013). Anteroseptal infarct (cited on or before 05-Oct-2015)  Willeen Cass, FNP-BC [facility]/Anesthesiology Phone: [phone] 12/04/2015 4:13 PM   Addendum:   Dr. Grandville Silos called about pt's large pleural effusion. Pt is now scheduled for thoracentesis on 12/10/15.   Pt will need further assessment by her assigned anesthesiologist DOS.   Willeen Cass, FNP-BC [facility] Stay [facility]/Anesthesiology Phone: [phone] 12/05/2015 1:14 PM

## 2015-12-04 NOTE — Pre-Procedure Instructions (Signed)
JAELEE GEREN  12/04/2015      [id number] - [place], [place] - [address] [place] [address] Phone: [phone] Fax: [phone]    Your procedure is scheduled on Sept 7.  Report to [facility] Admitting at 700 A.M.  Call this number if you have problems the morning of surgery:  [phone]   Remember:  Do not eat food or drink liquids after midnight.  Take these medicines the morning of surgery with A SIP OF WATER Diltiazem (Cardizem CD), Metoprolol (Lopressor)  Stop taking aspirin, BC's, Goody's, Herbal medications, Fish Oil, Ibuprofen, Advil, Motrin, Aleve, Vitamins   Do not wear jewelry, make-up or nail polish.  Do not wear lotions, powders, or perfumes, or deoderant.  Do not shave 48 hours prior to surgery.  Men may shave face and neck.  Do not bring valuables to the hospital.  [facility] is not responsible for any belongings or valuables.  Contacts, dentures or bridgework may not be worn into surgery.  Leave your suitcase in the car.  After surgery it may be brought to your room.  For patients admitted to the hospital, discharge time will be determined by your treatment team.  Patients discharged the day of surgery will not be allowed to drive home.    Special instructions:  [place] - Preparing for Surgery  Before surgery, you can play an important role.  Because skin is not sterile, your skin needs to be as free of germs as possible.  You can reduce the number of germs on you skin by washing with CHG (chlorahexidine gluconate) soap before surgery.  CHG is an antiseptic cleaner which kills germs and bonds with the skin to continue killing germs even after washing.  Please DO NOT use if you have an allergy to CHG or antibacterial soaps.  If your skin becomes reddened/irritated stop using the CHG and inform your nurse when you arrive at Short Stay.  Do not shave (including legs and underarms) for at least 48 hours prior  to the first CHG shower.  You may shave your face.  Please follow these instructions carefully:   1.  Shower with CHG Soap the night before surgery and the morning of Surgery.  2.  If you choose to wash your hair, wash your hair first as usual with your   normal shampoo.  3.  After you shampoo, rinse your hair and body thoroughly to remove the                      Shampoo.  4.  Use CHG as you would any other liquid soap.  You can apply chg directly to the skin and wash gently with scrungie or a clean washcloth.  5.  Apply the CHG Soap to your body ONLY FROM THE NECK DOWN.    Do not use on open wounds or open sores.  Avoid contact with your eyes,   ears, mouth and genitals (private parts).  Wash genitals (private parts)    with your normal soap.  6.  Wash thoroughly, paying special attention to the area where your surgery  will be performed.  7.  Thoroughly rinse your body with warm water from the neck down.  8.  DO NOT shower/wash with your normal soap after using and rinsing off   the CHG Soap.  9.  Pat yourself dry with a clean towel.  10.  Wear clean pajamas.            11.  Place clean sheets on your bed the night of your first shower and do not sleep with pets.  Day of Surgery  Do not apply any lotions/deoderants the morning of surgery.  Please wear clean clothes to the hospital/surgery center.     Please read over the following fact sheets that you were given. Pain Booklet and Surgical Site Infection Prevention

## 2015-12-05 ENCOUNTER — Other Ambulatory Visit (HOSPITAL_COMMUNITY): Payer: Self-pay | Admitting: General Surgery

## 2015-12-05 DIAGNOSIS — J90 Pleural effusion, not elsewhere classified: Secondary | ICD-10-CM

## 2015-12-09 ENCOUNTER — Other Ambulatory Visit: Payer: Self-pay | Admitting: General Surgery

## 2015-12-09 DIAGNOSIS — J90 Pleural effusion, not elsewhere classified: Secondary | ICD-10-CM

## 2015-12-10 ENCOUNTER — Ambulatory Visit (HOSPITAL_COMMUNITY): Payer: Medicare PPO

## 2015-12-10 NOTE — Discharge Instructions (Signed)
Thoracentesis, Care After °Refer to this sheet in the next few weeks. These instructions provide you with information about caring for yourself after your procedure. Your health care provider may also give you more specific instructions. Your treatment has been planned according to current medical practices, but problems sometimes occur. Call your health care provider if you have any problems or questions after your procedure. °WHAT TO EXPECT AFTER THE PROCEDURE °After your procedure, it is common to have pain at the puncture site. °HOME CARE INSTRUCTIONS °· Take medicines only as directed by your health care provider. °· You may return to your normal diet and normal activities as directed by your health care provider. °· Drink enough fluid to keep your urine clear or pale yellow. °· Do not take baths, swim, or use a hot tub until your health care provider approves. °· Follow your health care provider's instructions about: °¨ Puncture site care. °¨ Bandage (dressing) changes and removal. °· Check your puncture site every day for signs of infection. Watch for: °¨ Redness, swelling, or pain. °¨ Fluid, blood, or pus. °· Keep all follow-up visits as directed by your health care provider. This is important. °SEEK MEDICAL CARE IF: °· You have redness, swelling, or pain at your puncture site. °· You have fluid, blood, or pus coming from your puncture site. °· You have a fever. °· You have chills. °· You have nausea or vomiting. °· You have trouble breathing. °· You develop a worsening cough. °SEEK IMMEDIATE MEDICAL CARE IF: °· You have extreme shortness of breath. °· You develop chest pain. °· You faint or feel light-headed. °  °This information is not intended to replace advice given to you by your health care provider. Make sure you discuss any questions you have with your health care provider. °  °Document Released: 04/12/2014 Document Reviewed: 04/12/2014 °Elsevier Interactive Patient Education ©2016 Elsevier Inc. ° °

## 2015-12-11 ENCOUNTER — Encounter (HOSPITAL_COMMUNITY): Admission: RE | Payer: Self-pay | Source: Ambulatory Visit

## 2015-12-11 ENCOUNTER — Ambulatory Visit (HOSPITAL_COMMUNITY): Admission: RE | Admit: 2015-12-11 | Payer: Medicare PPO | Source: Ambulatory Visit | Admitting: General Surgery

## 2015-12-11 SURGERY — EXCISION MASS
Anesthesia: General | Laterality: Right

## 2015-12-12 ENCOUNTER — Other Ambulatory Visit: Payer: Self-pay | Admitting: General Surgery

## 2015-12-15 ENCOUNTER — Ambulatory Visit
Admission: RE | Admit: 2015-12-15 | Discharge: 2015-12-15 | Disposition: A | Discharge status: Home / Self Care | Payer: Medicare PPO | Source: Ambulatory Visit | Attending: General Surgery | Admitting: General Surgery

## 2015-12-15 ENCOUNTER — Ambulatory Visit
Admission: RE | Admit: 2015-12-15 | Discharge: 2015-12-15 | Disposition: A | Discharge status: Home / Self Care | Payer: Medicare PPO | Source: Ambulatory Visit | Attending: Interventional Radiology | Admitting: Interventional Radiology

## 2015-12-15 ENCOUNTER — Telehealth: Payer: Self-pay | Admitting: Family Medicine

## 2015-12-15 DIAGNOSIS — J449 Chronic obstructive pulmonary disease, unspecified: Secondary | ICD-10-CM | POA: Insufficient documentation

## 2015-12-15 DIAGNOSIS — J90 Pleural effusion, not elsewhere classified: Secondary | ICD-10-CM | POA: Diagnosis not present

## 2015-12-15 DIAGNOSIS — J948 Other specified pleural conditions: Secondary | ICD-10-CM | POA: Insufficient documentation

## 2015-12-15 NOTE — Telephone Encounter (Signed)
All I know about is the note and Rx that I had faxed and gave to [facility].

## 2015-12-15 NOTE — Telephone Encounter (Signed)
Dr. Lacinda Axon, do you recall filling out anything?

## 2015-12-15 NOTE — Telephone Encounter (Signed)
I have not received paperwork 

## 2015-12-15 NOTE — Telephone Encounter (Signed)
Roselyn Reef is this your patient? If so, did you all complete paperwork on this, thanks

## 2015-12-15 NOTE — Telephone Encounter (Signed)
Dr Lacinda Axon faxed the RX and the note from the day that he seen the patient. It was faxed 11/28/15 at 6:57 AM to [place] is where Dr. Lacinda Axon said that it was for. He said that he is not sure what else they need.

## 2015-12-15 NOTE — Telephone Encounter (Signed)
Please advise if paperwork was sent in for this patient, thanks

## 2015-12-15 NOTE — Telephone Encounter (Signed)
Left a VM for Lisa Chan, with details that paperwork was sen ton 8/25. thanks

## 2015-12-15 NOTE — Telephone Encounter (Signed)
It is our patient, but Dr. Lacinda Axon saw the patient and order the oxygen for patient. We do not have any paper work for this patient.

## 2015-12-15 NOTE — Procedures (Signed)
Interventional Radiology Procedure Note  Procedure: [place] thora. Left.  ~800-900 cc of thin yellow fluid.  No labs sent.  Complications: None Recommendations:  - CXR - OK for DC.  - Routine wound care   Signed,  Dulcy Fanny. Earleen Newport, DO

## 2015-12-15 NOTE — Telephone Encounter (Signed)
Pt saw Dr. Lacinda Axon last month for oxygen. She was wondering if paper work has been completed. Please call daughter, [name] at [phone].

## 2015-12-16 DIAGNOSIS — R269 Unspecified abnormalities of gait and mobility: Secondary | ICD-10-CM | POA: Diagnosis not present

## 2015-12-16 DIAGNOSIS — R0602 Shortness of breath: Secondary | ICD-10-CM | POA: Diagnosis not present

## 2015-12-16 DIAGNOSIS — S2231XA Fracture of one rib, right side, initial encounter for closed fracture: Secondary | ICD-10-CM | POA: Diagnosis not present

## 2015-12-16 NOTE — Telephone Encounter (Signed)
Spoke with [facility] oxygen supply department.  They received the rx and the OV notes that were sent over, I got clarity that the RX needs Dr. Jonathon Jordan NPI # on it and it needs to say continuous.  Second, the the levels that are documented in the OV need to say that the O2 reading was AT REST.  Lastly the documentation in that OV needs to also state that she has tried alternatives I.e nebulizer or medications like lasix for medicare to approve the order.  Please advise? thanks

## 2015-12-16 NOTE — Telephone Encounter (Signed)
Faxed as requested

## 2015-12-16 NOTE — Telephone Encounter (Signed)
Lisa Chan has the prescription and office note, but has not received the form that needs to be completed.

## 2015-12-16 NOTE — Telephone Encounter (Signed)
Ashleigh, do you have a copy of what he sent on the 25th?

## 2015-12-16 NOTE — Telephone Encounter (Signed)
Changes made. Given to [place].

## 2015-12-16 NOTE — Telephone Encounter (Signed)
Patients daughter stated the "at risk rate and a NPI has to be completed, The form has to be fully completed.[facility]) Daughter, [name] [phone]

## 2015-12-17 ENCOUNTER — Telehealth: Payer: Self-pay | Admitting: *Deleted

## 2015-12-17 NOTE — Telephone Encounter (Signed)
The order need to be dated per [facility] from [facility]  Fax [phone]

## 2015-12-18 NOTE — Telephone Encounter (Signed)
refaxed to number provided with date added. thanks

## 2015-12-29 ENCOUNTER — Telehealth: Payer: Self-pay | Admitting: *Deleted

## 2015-12-29 NOTE — Telephone Encounter (Signed)
Scheduled patient to come in on Thursday 01/08/16. She has not been on the oxygen in 3 weeks and O2 stats have been 94 per daughter.

## 2015-12-29 NOTE — Telephone Encounter (Signed)
Noted  

## 2015-12-29 NOTE — Telephone Encounter (Signed)
Pt daughter stated patient no longer wants her oxygen machine , and will need a statement for [facility] that O2 has been discontinued,this is patients discission    Contact daughter [name] [phone]

## 2015-12-29 NOTE — Telephone Encounter (Signed)
Please advise 

## 2015-12-29 NOTE — Telephone Encounter (Signed)
Noted. If patient does not want to use the oxygen and we can discontinue it. She is to follow-up in the office.

## 2015-12-31 ENCOUNTER — Encounter (HOSPITAL_BASED_OUTPATIENT_CLINIC_OR_DEPARTMENT_OTHER): Payer: Self-pay | Admitting: *Deleted

## 2016-01-01 ENCOUNTER — Encounter (HOSPITAL_BASED_OUTPATIENT_CLINIC_OR_DEPARTMENT_OTHER): Payer: Self-pay

## 2016-01-01 ENCOUNTER — Ambulatory Visit (HOSPITAL_BASED_OUTPATIENT_CLINIC_OR_DEPARTMENT_OTHER): Payer: Medicare PPO | Admitting: Anesthesiology

## 2016-01-01 ENCOUNTER — Encounter (HOSPITAL_BASED_OUTPATIENT_CLINIC_OR_DEPARTMENT_OTHER)
Admission: RE | Disposition: A | Discharge status: Home / Self Care | Payer: Self-pay | Source: Ambulatory Visit | Attending: General Surgery

## 2016-01-01 ENCOUNTER — Ambulatory Visit (HOSPITAL_BASED_OUTPATIENT_CLINIC_OR_DEPARTMENT_OTHER)
Admission: RE | Admit: 2016-01-01 | Discharge: 2016-01-01 | Disposition: A | Discharge status: Home / Self Care | Payer: Medicare PPO | Source: Ambulatory Visit | Attending: General Surgery | Admitting: General Surgery

## 2016-01-01 DIAGNOSIS — Z79899 Other long term (current) drug therapy: Secondary | ICD-10-CM | POA: Diagnosis not present

## 2016-01-01 DIAGNOSIS — R2231 Localized swelling, mass and lump, right upper limb: Secondary | ICD-10-CM | POA: Diagnosis not present

## 2016-01-01 DIAGNOSIS — C44692 Other specified malignant neoplasm of skin of right upper limb, including shoulder: Secondary | ICD-10-CM | POA: Diagnosis not present

## 2016-01-01 DIAGNOSIS — I10 Essential (primary) hypertension: Secondary | ICD-10-CM | POA: Diagnosis not present

## 2016-01-01 DIAGNOSIS — C44622 Squamous cell carcinoma of skin of right upper limb, including shoulder: Secondary | ICD-10-CM | POA: Insufficient documentation

## 2016-01-01 DIAGNOSIS — R000 Tachycardia, unspecified: Secondary | ICD-10-CM | POA: Diagnosis not present

## 2016-01-01 DIAGNOSIS — D649 Anemia, unspecified: Secondary | ICD-10-CM | POA: Diagnosis not present

## 2016-01-01 HISTORY — PX: MASS EXCISION: SHX2000

## 2016-01-01 HISTORY — DX: Alcohol abuse with intoxication delirium: F10.121

## 2016-01-01 HISTORY — DX: Essential (primary) hypertension: I10

## 2016-01-01 HISTORY — DX: Pleural effusion, not elsewhere classified: J90

## 2016-01-01 LAB — POCT I-STAT, CHEM 8
BUN: <3 mg/dL — ABNORMAL LOW (ref 6–20)
CALCIUM ION: 0.88 mmol/L — AB (ref 1.15–1.40)
Chloride: 102 mmol/L (ref 101–111)
Creatinine, Ser: 0.5 mg/dL (ref 0.44–1.00)
Glucose, Bld: 95 mg/dL (ref 65–99)
HEMATOCRIT: 40 % (ref 36.0–46.0)
Hemoglobin: 13.6 g/dL (ref 12.0–15.0)
Potassium: 3.3 mmol/L — ABNORMAL LOW (ref 3.5–5.1)
SODIUM: 138 mmol/L (ref 135–145)
TCO2: 24 mmol/L (ref 0–100)

## 2016-01-01 SURGERY — EXCISION MASS
Anesthesia: General | Site: Shoulder | Laterality: Right

## 2016-01-01 MED ORDER — PROPOFOL 10 MG/ML IV BOLUS
INTRAVENOUS | Status: DC | PRN
  Administered 2016-01-01: 100 mg via INTRAVENOUS

## 2016-01-01 MED ORDER — LACTATED RINGERS IV SOLN
INTRAVENOUS | Status: DC
  Administered 2016-01-01: 10 mL/h via INTRAVENOUS

## 2016-01-01 MED ORDER — LIDOCAINE HCL (CARDIAC) 20 MG/ML IV SOLN
INTRAVENOUS | Status: DC | PRN
  Administered 2016-01-01: 60 mg via INTRAVENOUS

## 2016-01-01 MED ORDER — MEPERIDINE HCL 25 MG/ML IJ SOLN: 6.2500 mg | INTRAMUSCULAR | Status: DC | PRN

## 2016-01-01 MED ORDER — CEFAZOLIN SODIUM-DEXTROSE 2-4 GM/100ML-% IV SOLN
2.0000 g | INTRAVENOUS | Status: AC
  Administered 2016-01-01: 2 g via INTRAVENOUS

## 2016-01-01 MED ORDER — FENTANYL CITRATE (PF) 100 MCG/2ML IJ SOLN
INTRAMUSCULAR | Status: AC
  Filled 2016-01-01: qty 2

## 2016-01-01 MED ORDER — DEXAMETHASONE SODIUM PHOSPHATE 4 MG/ML IJ SOLN
INTRAMUSCULAR | Status: DC | PRN
  Administered 2016-01-01: 4 mg via INTRAVENOUS

## 2016-01-01 MED ORDER — FENTANYL CITRATE (PF) 100 MCG/2ML IJ SOLN: 25.0000 ug | INTRAMUSCULAR | Status: DC | PRN

## 2016-01-01 MED ORDER — PROPOFOL 500 MG/50ML IV EMUL
INTRAVENOUS | Status: AC
  Filled 2016-01-01: qty 50

## 2016-01-01 MED ORDER — BACITRACIN-NEOMYCIN-POLYMYXIN 400-5-5000 EX OINT
TOPICAL_OINTMENT | CUTANEOUS | Status: AC
Start: 2016-01-01 — End: 2016-01-01
  Filled 2016-01-01: qty 1

## 2016-01-01 MED ORDER — ONDANSETRON HCL 4 MG/2ML IJ SOLN
INTRAMUSCULAR | Status: DC | PRN
  Administered 2016-01-01: 4 mg via INTRAVENOUS

## 2016-01-01 MED ORDER — MIDAZOLAM HCL 2 MG/2ML IJ SOLN: 1.0000 mg | INTRAMUSCULAR | Status: DC | PRN

## 2016-01-01 MED ORDER — LIDOCAINE 2% (20 MG/ML) 5 ML SYRINGE
INTRAMUSCULAR | Status: AC
  Filled 2016-01-01: qty 5

## 2016-01-01 MED ORDER — CHLORHEXIDINE GLUCONATE CLOTH 2 % EX PADS: 6.0000 | MEDICATED_PAD | Freq: Once | CUTANEOUS | Status: DC

## 2016-01-01 MED ORDER — BUPIVACAINE-EPINEPHRINE 0.5% -1:200000 IJ SOLN
INTRAMUSCULAR | Status: DC | PRN
  Administered 2016-01-01: 20 mL

## 2016-01-01 MED ORDER — CEFAZOLIN SODIUM-DEXTROSE 2-4 GM/100ML-% IV SOLN
INTRAVENOUS | Status: AC
  Filled 2016-01-01: qty 100

## 2016-01-01 MED ORDER — TRAMADOL HCL 50 MG PO TABS: 50.0000 mg | ORAL_TABLET | Freq: Four times a day (QID) | ORAL | 0 refills | Status: DC | PRN

## 2016-01-01 MED ORDER — ONDANSETRON HCL 4 MG/2ML IJ SOLN
INTRAMUSCULAR | Status: AC
  Filled 2016-01-01: qty 2

## 2016-01-01 MED ORDER — GLYCOPYRROLATE 0.2 MG/ML IJ SOLN: 0.2000 mg | Freq: Once | INTRAMUSCULAR | Status: DC | PRN

## 2016-01-01 MED ORDER — FENTANYL CITRATE (PF) 100 MCG/2ML IJ SOLN
50.0000 ug | INTRAMUSCULAR | Status: DC | PRN
  Administered 2016-01-01: 50 ug via INTRAVENOUS

## 2016-01-01 MED ORDER — DEXAMETHASONE SODIUM PHOSPHATE 10 MG/ML IJ SOLN
INTRAMUSCULAR | Status: AC
  Filled 2016-01-01: qty 1

## 2016-01-01 MED ORDER — SCOPOLAMINE 1 MG/3DAYS TD PT72: 1.0000 | MEDICATED_PATCH | Freq: Once | TRANSDERMAL | Status: DC | PRN

## 2016-01-01 SURGICAL SUPPLY — 57 items
BENZOIN TINCTURE PRP APPL 2/3 (GAUZE/BANDAGES/DRESSINGS) IMPLANT
BLADE CLIPPER SURG (BLADE) IMPLANT
BLADE HEX COATED 2.75 (ELECTRODE) ×3 IMPLANT
BLADE SURG 15 STRL LF DISP TIS (BLADE) ×1 IMPLANT
BLADE SURG 15 STRL SS (BLADE) ×2
CANISTER SUCT 1200ML W/VALVE (MISCELLANEOUS) ×3 IMPLANT
CHLORAPREP W/TINT 26ML (MISCELLANEOUS) IMPLANT
CLOSURE WOUND 1/2 X4 (GAUZE/BANDAGES/DRESSINGS)
COVER BACK TABLE 60X90IN (DRAPES) ×3 IMPLANT
COVER MAYO STAND STRL (DRAPES) ×3 IMPLANT
DECANTER SPIKE VIAL GLASS SM (MISCELLANEOUS) ×3 IMPLANT
DERMABOND ADVANCED (GAUZE/BANDAGES/DRESSINGS)
DERMABOND ADVANCED .7 DNX12 (GAUZE/BANDAGES/DRESSINGS) IMPLANT
DRAPE LAPAROTOMY 100X72 PEDS (DRAPES) ×3 IMPLANT
DRAPE U-SHAPE 76X120 STRL (DRAPES) IMPLANT
DRAPE UTILITY XL STRL (DRAPES) ×3 IMPLANT
ELECT REM PT RETURN 9FT ADLT (ELECTROSURGICAL) ×3
ELECTRODE REM PT RTRN 9FT ADLT (ELECTROSURGICAL) ×1 IMPLANT
GAUZE SPONGE 4X4 12PLY STRL (GAUZE/BANDAGES/DRESSINGS) ×3 IMPLANT
GLOVE BIO SURGEON STRL SZ8 (GLOVE) ×6 IMPLANT
GLOVE BIOGEL PI IND STRL 7.0 (GLOVE) ×2 IMPLANT
GLOVE BIOGEL PI IND STRL 8 (GLOVE) ×1 IMPLANT
GLOVE BIOGEL PI INDICATOR 7.0 (GLOVE) ×4
GLOVE BIOGEL PI INDICATOR 8 (GLOVE) ×2
GLOVE ECLIPSE 6.5 STRL STRAW (GLOVE) ×3 IMPLANT
GOWN STRL REUS W/ TWL LRG LVL3 (GOWN DISPOSABLE) ×1 IMPLANT
GOWN STRL REUS W/ TWL XL LVL3 (GOWN DISPOSABLE) ×1 IMPLANT
GOWN STRL REUS W/TWL LRG LVL3 (GOWN DISPOSABLE) ×2
GOWN STRL REUS W/TWL XL LVL3 (GOWN DISPOSABLE) ×2
NEEDLE HYPO 25X1 1.5 SAFETY (NEEDLE) ×3 IMPLANT
NEEDLE PRECISIONGLIDE 27X1.5 (NEEDLE) IMPLANT
NS IRRIG 1000ML POUR BTL (IV SOLUTION) ×3 IMPLANT
PACK BASIN DAY SURGERY FS (CUSTOM PROCEDURE TRAY) ×3 IMPLANT
PENCIL BUTTON HOLSTER BLD 10FT (ELECTRODE) ×3 IMPLANT
SHEET MEDIUM DRAPE 40X70 STRL (DRAPES) IMPLANT
SLEEVE SCD COMPRESS KNEE MED (MISCELLANEOUS) ×3 IMPLANT
SPONGE GAUZE 4X4 12PLY STER LF (GAUZE/BANDAGES/DRESSINGS) IMPLANT
SPONGE LAP 4X18 X RAY DECT (DISPOSABLE) IMPLANT
STRIP CLOSURE SKIN 1/2X4 (GAUZE/BANDAGES/DRESSINGS) IMPLANT
SUT ETHILON 3 0 PS 1 (SUTURE) ×12 IMPLANT
SUT ETHILON 5 0 PS 2 18 (SUTURE) IMPLANT
SUT MON AB 4-0 PC3 18 (SUTURE) IMPLANT
SUT SILK 2 0 FS (SUTURE) ×3 IMPLANT
SUT VIC AB 3-0 PS1 18 (SUTURE)
SUT VIC AB 3-0 PS1 18XBRD (SUTURE) IMPLANT
SUT VIC AB 3-0 SH 27 (SUTURE)
SUT VIC AB 3-0 SH 27X BRD (SUTURE) IMPLANT
SUT VIC AB 4-0 SH 18 (SUTURE) IMPLANT
SUT VIC AB 5-0 PS2 18 (SUTURE) IMPLANT
SUT VICRYL 3-0 CR8 SH (SUTURE) ×3 IMPLANT
SYR BULB 3OZ (MISCELLANEOUS) ×3 IMPLANT
SYR CONTROL 10ML LL (SYRINGE) ×3 IMPLANT
TOWEL OR 17X24 6PK STRL BLUE (TOWEL DISPOSABLE) ×3 IMPLANT
TOWEL OR NON WOVEN STRL DISP B (DISPOSABLE) IMPLANT
TUBE CONNECTING 20'X1/4 (TUBING) ×1
TUBE CONNECTING 20X1/4 (TUBING) ×2 IMPLANT
YANKAUER SUCT BULB TIP NO VENT (SUCTIONS) ×3 IMPLANT

## 2016-01-01 NOTE — Discharge Instructions (Signed)

## 2016-01-01 NOTE — Interval H&P Note (Signed)
History and Physical Interval Note:  01/01/2016 9:07 AM  Lisa Chan  has presented today for surgery, with the diagnosis of MASS OF SKIN OF RIGHT SHOULDER 4CM  The various methods of treatment have been discussed with the patient and family. After consideration of risks, benefits and other options for treatment, the patient has consented to  Procedure(s): EXCISION OF MASS OF RIGHT SHOULDER (Right) as a surgical intervention .  The patient's history has been reviewed, patient examined, no change in status, stable for surgery.  Site marked. I have reviewed the patient's chart and labs.  Questions were answered to the patient's satisfaction.     Darian Cansler E

## 2016-01-01 NOTE — Transfer of Care (Signed)
Immediate Anesthesia Transfer of Care Note  Patient: Lisa Chan  Procedure(s) Performed: Procedure(s): EXCISION OF MASS OF RIGHT SHOULDER (Right)  Patient Location: PACU  Anesthesia Type:General  Level of Consciousness: awake, alert , oriented and patient cooperative  Airway & Oxygen Therapy: Patient Spontanous Breathing and Patient connected to face mask oxygen  Post-op Assessment: Report given to RN and Post -op Vital signs reviewed and stable  Post vital signs: Reviewed and stable  Last Vitals:  Vitals:   01/01/16 0827  BP: (!) 159/76  Pulse: 78  Temp: 36.6 C    Last Pain:  Vitals:   01/01/16 0827  TempSrc: Oral         Complications: No apparent anesthesia complications

## 2016-01-01 NOTE — Op Note (Signed)
01/01/2016  10:09 AM  PATIENT:  Lisa Chan  82 y.o. female  PRE-OPERATIVE DIAGNOSIS:  MASS OF SKIN OF RIGHT SHOULDER  POST-OPERATIVE DIAGNOSIS:  MASS OF SKIN OF RIGHT SHOULDER  PROCEDURE:  Procedure(s): EXCISION OF MASS OF RIGHT SHOULDER 6x5cm WITH LAYERED CLOSURE  SURGEON:  Surgeon(s): Georganna Skeans, MD  ASSISTANTS: none   ANESTHESIA:   local and general  EBL:  Total I/O In: 700 [I.V.:700] Out: -   BLOOD ADMINISTERED:none  DRAINS: none   SPECIMEN:  Excision  DISPOSITION OF SPECIMEN:  PATHOLOGY  COUNTS:  YES  DICTATION: .Dragon Dictation Procedure in detail: Ms. Savacool presents for excision mass of the skin on right shoulder. She was identified in preop holding area. Her site was marked. Informed consent was obtained she received intravenous antibiotics. She was brought to the operating room and general anesthesia with laryngeal mask airway was administered by the anesthesia staff. Her right shoulder was prepped and draped in sterile fashion. We did a time out procedure. An elliptical incision was measured out to encompass a margin all around this mass. Local anesthetic was injected. Elliptical incision was made. Subcutaneous tissues were dissected down to the underlying fascia and the mass was completely removed. It was oriented for pathology and sent. Hemostasis was obtained with cautery. A change gloves. Flaps were raised medially and laterally. The wound was then closed with subcutaneous tissues closed with interrupted 3-0 Vicryl. Skin was closed with a combination of horizontal mattress and interrupted 30 nylons. A closed without excessive tension. A moderate ointment and a sterile dressing were applied. She tolerated the procedure well without apparent complications to recovery in stable condition. PATIENT DISPOSITION:  PACU - hemodynamically stable.   Delay start of Pharmacological VTE agent (>24hrs) due to surgical blood loss or risk of bleeding:  no  Georganna Skeans, MD, MPH, FACS Pager: [phone]  9/28/201710:09 AM

## 2016-01-01 NOTE — Anesthesia Procedure Notes (Signed)
Procedure Name: LMA Insertion Date/Time: 01/01/2016 9:25 AM Performed by: Savayah Waltrip D Pre-anesthesia Checklist: Patient identified, Emergency Drugs available, Suction available and Patient being monitored Patient Re-evaluated:Patient Re-evaluated prior to inductionOxygen Delivery Method: Circle system utilized Preoxygenation: Pre-oxygenation with 100% oxygen Intubation Type: IV induction Ventilation: Mask ventilation without difficulty LMA: LMA inserted LMA Size: 4.0 Number of attempts: 1 Airway Equipment and Method: Bite block Placement Confirmation: positive ETCO2 Tube secured with: Tape Dental Injury: Teeth and Oropharynx as per pre-operative assessment

## 2016-01-01 NOTE — H&P (Signed)
Lisa Chan 10/20/2015 10:43 AM Location: [facility] Patient #: [id number] DOB: 1926-11-10 Married / Language: Lisa Chan / Race: White Female   History of Present Illness Lisa Neri E. Grandville Silos MD; 10/20/2015 11:13 AM) The patient is a 82 year old female who presents with a complaint of Mass. Lisa Chan presents for follow-up after admission to the trauma service. She suffered a fall and had multiple left rib fractures with hemopneumothorax. During that admission, she was noted to have a 4 cm right shoulder mass of the skin. She has had for many years. She presents for follow-up to discuss further management of that. Additionally, her daughter and she is staying with, frequent urination, often every 2 hours including during the night. No dysuria per se. No fevers. Her appetite has been moderate and she is trying to take some boost supplements.   Other Problems Lisa Chan, CMA; 10/20/2015 10:43 AM) Alcohol Abuse Arthritis High blood pressure Oophorectomy Bilateral. Thyroid Disease  Past Surgical History Lisa Chan, CMA; 10/20/2015 10:43 AM) Cataract Surgery Bilateral. Hysterectomy (not due to cancer) - Complete  Diagnostic Studies History Lisa Chan, CMA; 10/20/2015 10:43 AM) Colonoscopy never Mammogram never Pap Smear >5 years ago  Allergies Lisa Chan, CMA; 10/20/2015 10:44 AM) No Known Drug Allergies07/17/2017  Medication History Lisa Chan, CMA; 10/20/2015 10:44 AM) Metoprolol Tartrate (100MG  Tablet, Oral) Active. DiltiaZEM HCl ER Coated Beads (180MG  Capsule ER 24HR, Oral) Active. Medications Reconciled  Social History Lisa Chan, [place]; 10/20/2015 10:43 AM) Alcohol use Moderate alcohol use. No caffeine use No drug use Tobacco use Never smoker.  Family History Lisa Chan, [place]; 10/20/2015 10:43 AM) First Degree Relatives No pertinent family history  Pregnancy / Birth History Lisa Chan, [place]; 10/20/2015 10:43 AM) Lisa Chan Age 3 Maternal  age 28-25 Para 1    Review of Systems Lisa Chan CMA; 10/20/2015 10:43 AM) General Present- Appetite Loss and Fatigue. Not Present- Chills, Fever, Night Sweats, Weight Gain and Weight Loss. Skin Present- Non-Healing Wounds. Not Present- Change in Wart/Mole, Dryness, Hives, Jaundice, New Lesions, Rash and Ulcer. HEENT Not Present- Earache, Hearing Loss, Hoarseness, Nose Bleed, Oral Ulcers, Ringing in the Ears, Seasonal Allergies, Sinus Pain, Sore Throat, Visual Disturbances, Wears glasses/contact lenses and Yellow Eyes. Breast Not Present- Breast Mass, Breast Pain, Nipple Discharge and Skin Changes. Cardiovascular Present- Rapid Heart Rate and Shortness of Breath. Not Present- Chest Pain, Difficulty Breathing Lying Down, Leg Cramps, Palpitations and Swelling of Extremities. Gastrointestinal Not Present- Abdominal Pain, Bloating, Bloody Stool, Change in Bowel Habits, Chronic diarrhea, Constipation, Difficulty Swallowing, Excessive gas, Gets full quickly at meals, Hemorrhoids, Indigestion, Nausea, Rectal Pain and Vomiting. Female Genitourinary Present- Nocturia. Not Present- Frequency, Painful Urination, Pelvic Pain and Urgency. Musculoskeletal Not Present- Back Pain, Joint Pain, Joint Stiffness, Muscle Pain, Muscle Weakness and Swelling of Extremities. Neurological Present- Trouble walking. Not Present- Decreased Memory, Fainting, Headaches, Numbness, Seizures, Tingling, Tremor and Weakness. Psychiatric Not Present- Anxiety, Bipolar, Change in Sleep Pattern, Depression, Fearful and Frequent crying. Endocrine Not Present- Cold Intolerance, Excessive Hunger, Hair Changes, Heat Intolerance, Hot flashes and New Diabetes. Hematology Present- Easy Bruising. Not Present- Blood Thinners, Excessive bleeding, Gland problems, HIV and Persistent Infections.  Vitals Lisa Chan CMA; 10/20/2015 10:44 AM) 10/20/2015 10:44 AM Weight: 99.8 lb Height: 61in Body Surface Area: 1.41 m Body Mass Index: 18.86  kg/m  Temp.: 97.82F  Pulse: 80 (Regular)  BP: 124/68 (Sitting, Left Arm, Standard)       Physical Exam Lisa Neri E. Grandville Silos MD; 10/20/2015 11:14 AM) Integumentary Note: Warm and dry, see below  Chest and Lung Exam Note: Clear to auscultation, respiratory excursion is moderate, sutures left chest wall were removed and a dressing was placed   Cardiovascular Note: Regular rate and rhythm   Abdomen Note: Soft and nontender   Neurologic Note: In wheelchair, alert, speech fluent   Musculoskeletal Note: 4 cm mass of the skin of the right shoulder with granulation type tissue and mild discharge of clear fluid     Assessment & Plan Lisa Neri E. Grandville Silos MD; 10/20/2015 11:17 AM) FREQUENT URINATION (R35.0) Impression: We'll check urinalysis with micro and culture. If this is negative, she plans follow up with primary care physician later this month. MASS OF SKIN OF SHOULDER, RIGHT (R22.31) Impression: I am concerned this may be a squamous cell carcinoma. I have offered excision as an outpatient surgical procedure.  Update - no changes in exam. She underwent L thoracentesis on 9/11 in preparation for surgery. CXR much improved.  Georganna Skeans, MD, MPH, FACS Trauma: [phone] General Surgery: [phone]

## 2016-01-01 NOTE — Anesthesia Postprocedure Evaluation (Signed)
Anesthesia Post Note  Patient: Lisa Chan  Procedure(s) Performed: Procedure(s) (LRB): EXCISION OF MASS OF RIGHT SHOULDER (Right)  Patient location during evaluation: PACU Anesthesia Type: General Level of consciousness: awake and alert Pain management: pain level controlled Vital Signs Assessment: post-procedure vital signs reviewed and stable Respiratory status: spontaneous breathing, nonlabored ventilation and respiratory function stable Cardiovascular status: blood pressure returned to baseline and stable Postop Assessment: no signs of nausea or vomiting Anesthetic complications: no    Last Vitals:  Vitals:   01/01/16 1045 01/01/16 1115  BP: (!) 158/80 (!) 159/82  Pulse: 71 71  Resp: (!) 21 20  Temp:  36.4 C    Last Pain:  Vitals:   01/01/16 0827  TempSrc: Oral                 Brysun Eschmann A

## 2016-01-01 NOTE — Anesthesia Preprocedure Evaluation (Addendum)
Anesthesia Evaluation  Patient identified by MRN, date of birth, ID band Patient awake    Reviewed: Allergy & Precautions, NPO status , Patient's Chart, lab work & pertinent test results, reviewed documented beta blocker date and time   Airway Mallampati: II  TM Distance: >3 FB Neck ROM: Full    Dental  (+) Teeth Intact, Dental Advisory Given   Pulmonary    breath sounds clear to auscultation       Cardiovascular hypertension, Pt. on medications and Pt. on home beta blockers  Rhythm:Regular Rate:Normal     Neuro/Psych    GI/Hepatic   Endo/Other    Renal/GU      Musculoskeletal   Abdominal   Peds  Hematology   Anesthesia Other Findings Hx of hemothorax in 7/17 with drainage and now with Sa02 of 95% today on room air.  Reproductive/Obstetrics                            Anesthesia Physical Anesthesia Plan  ASA: III  Anesthesia Plan: General   Post-op Pain Management: GA combined w/ Regional for post-op pain   Induction: Intravenous  Airway Management Planned: LMA  Additional Equipment:   Intra-op Plan:   Post-operative Plan: Extubation in OR  Informed Consent: I have reviewed the patients History and Physical, chart, labs and discussed the procedure including the risks, benefits and alternatives for the proposed anesthesia with the patient or authorized representative who has indicated his/her understanding and acceptance.   Dental advisory given  Plan Discussed with: CRNA, Anesthesiologist and Surgeon  Anesthesia Plan Comments:        Anesthesia Quick Evaluation

## 2016-01-02 ENCOUNTER — Encounter (HOSPITAL_BASED_OUTPATIENT_CLINIC_OR_DEPARTMENT_OTHER): Payer: Self-pay | Admitting: General Surgery

## 2016-01-08 ENCOUNTER — Ambulatory Visit (INDEPENDENT_AMBULATORY_CARE_PROVIDER_SITE_OTHER): Payer: Medicare PPO | Admitting: Family Medicine

## 2016-01-08 ENCOUNTER — Encounter: Payer: Self-pay | Admitting: Family Medicine

## 2016-01-08 VITALS — BP 110/66 | HR 70 | Temp 98.4°F | Wt 88.4 lb

## 2016-01-08 DIAGNOSIS — R600 Localized edema: Secondary | ICD-10-CM | POA: Diagnosis not present

## 2016-01-08 DIAGNOSIS — C4499 Other specified malignant neoplasm of skin, unspecified: Secondary | ICD-10-CM

## 2016-01-08 DIAGNOSIS — J9611 Chronic respiratory failure with hypoxia: Secondary | ICD-10-CM

## 2016-01-08 DIAGNOSIS — J90 Pleural effusion, not elsewhere classified: Secondary | ICD-10-CM

## 2016-01-08 DIAGNOSIS — D649 Anemia, unspecified: Secondary | ICD-10-CM | POA: Diagnosis not present

## 2016-01-08 NOTE — Assessment & Plan Note (Signed)
New-onset. No CHF symptoms. Suspect venous insufficiency though could be related to anemia as she has had this in the past. We'll check lab work today as outlined below to rule out other causes.

## 2016-01-08 NOTE — Progress Notes (Signed)
  Tommi Rumps, MD Phone: [phone]  Lisa Chan is a 82 y.o. female who presents today for follow-up.  Chronic respiratory failure: Patient noted on several occasions previously to have oxygen desaturations less than 90%. She was placed on home oxygen though she has stopped this over the last month. She reports she does not need to use this. She has no shortness of breath currently. She had a thoracentesis recently for a pleural effusion. She does not want any further workup for this issue at this time.  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 She follows up with them next week. She is unsure if she wants to see dermatology for follow-up of this. She additionally notes she has what appears to be a seborrheic keratosis on the right side of her face.  Bilateral ankle swelling has been going on for the last week. No shortness of breath or orthopnea or PND. Does have a history of anemia in the past. Recent TSH reviewed and normal. Kidney function and liver function appear to be normal as well.  PMH: nonsmoker.   ROS see history of present illness  Objective  Physical Exam Vitals:   01/08/16 1454  BP: 110/66  Pulse: 70  Temp: 98.4 F (36.9 C)    BP Readings from Last 3 Encounters:  01/08/16 110/66  01/01/16 (!) 159/82  12/15/15 (!) 176/81   Wt Readings from Last 3 Encounters:  01/08/16 88 lb 6 oz (40.1 kg)  01/01/16 91 lb (41.3 kg)  11/27/15 89 lb 3.2 oz (40.5 kg)    Physical Exam  Constitutional: No distress.  HENT:  Head:    Cardiovascular: Normal rate, regular rhythm and normal heart sounds.   2+ pitting edema bilateral ankles, no pretibial pitting  Pulmonary/Chest: Effort normal and breath sounds normal.  Neurological: She is alert.  Skin: Skin is warm and dry. She is not diaphoretic.     Assessment/Plan: Please see individual problem list.  Chronic respiratory failure (HCC) Chronic issue. Patient  no longer wants use oxygen. Oxygen today in the normal range. We will send a message to [facility] to discontinue the patient's oxygen.   Pleural effusion Patient had a recent thoracentesis. Breathing improved after this. Normal lung exam today. She does not want any further workup for this at this time. She'll continue to monitor.  Basosquamous carcinoma Removed recently from her right shoulder. No issues with this currently. Appears to have had clean margins per the pathology report. Discussed referral to dermatology. She will keep her follow-up with the surgeon next week.  Bilateral lower extremity edema New-onset. No CHF symptoms. Suspect venous insufficiency though could be related to anemia as she has had this in the past. We'll check lab work today as outlined below to rule out other causes.   Orders Placed This Encounter  Procedures  . Comp Met (CMET)  . CBC  . Iron and TIBC  . Ferritin  . Ambulatory referral to Dermatology    Referral Priority:   Routine    Referral Type:   Consultation    Referral Reason:   Specialty Services Required    Requested Specialty:   Dermatology    Number of Visits Requested:   1   Tommi Rumps, MD [place]

## 2016-01-08 NOTE — Patient Instructions (Signed)
Nice to see you. Please follow-up with your surgeon next week. We'll refer you to a dermatologist. Please monitor your breathing and if this worsens please let us know. We will get some lab work today and call you with the results.

## 2016-01-08 NOTE — Assessment & Plan Note (Signed)
Patient had a recent thoracentesis. Breathing improved after this. Normal lung exam today. She does not want any further workup for this at this time. She'll continue to monitor.

## 2016-01-08 NOTE — Assessment & Plan Note (Signed)
Chronic issue. Patient no longer wants use oxygen. Oxygen today in the normal range. We will send a message to [facility] to discontinue the patient's oxygen.

## 2016-01-08 NOTE — Assessment & Plan Note (Signed)
Removed recently from her right shoulder. No issues with this currently. Appears to have had clean margins per the pathology report. Discussed referral to dermatology. She will keep her follow-up with the surgeon next week.

## 2016-01-09 ENCOUNTER — Telehealth: Payer: Self-pay

## 2016-01-09 LAB — COMPREHENSIVE METABOLIC PANEL
ALBUMIN: 2.7 g/dL — AB (ref 3.5–5.2)
ALT: 9 U/L (ref 0–35)
AST: 24 U/L (ref 0–37)
Alkaline Phosphatase: 94 U/L (ref 39–117)
BILIRUBIN TOTAL: 0.6 mg/dL (ref 0.2–1.2)
BUN: 5 mg/dL — ABNORMAL LOW (ref 6–23)
CALCIUM: 8.1 mg/dL — AB (ref 8.4–10.5)
CO2: 31 mEq/L (ref 19–32)
CREATININE: 0.54 mg/dL (ref 0.40–1.20)
Chloride: 96 mEq/L (ref 96–112)
GFR: 113.38 mL/min (ref >60.00)
Glucose, Bld: 64 mg/dL — ABNORMAL LOW (ref 70–99)
Potassium: 3.4 mEq/L — ABNORMAL LOW (ref 3.5–5.1)
Sodium: 140 mEq/L (ref 135–145)
TOTAL PROTEIN: 6 g/dL (ref 6.0–8.3)

## 2016-01-09 LAB — CBC
HCT: 34.9 % — ABNORMAL LOW (ref 36.0–46.0)
Hemoglobin: 11.8 g/dL — ABNORMAL LOW (ref 12.0–15.0)
MCHC: 33.7 g/dL (ref 30.0–36.0)
MCV: 95.3 fl (ref 78.0–100.0)
Platelets: 229 10*3/uL (ref 150.0–400.0)
RBC: 3.66 Mil/uL — AB (ref 3.87–5.11)
RDW: 18.5 % — ABNORMAL HIGH (ref 11.5–15.5)
WBC: 8.6 10*3/uL (ref 4.0–10.5)

## 2016-01-09 LAB — IRON AND TIBC
%SAT: 37 % (ref 11–50)
IRON: 74 ug/dL (ref 45–160)
TIBC: 198 ug/dL — ABNORMAL LOW (ref 250–450)
UIBC: 124 ug/dL — AB (ref 125–400)

## 2016-01-09 LAB — FERRITIN: FERRITIN: 85.5 ng/mL (ref 10.0–291.0)

## 2016-01-09 NOTE — Telephone Encounter (Signed)
Spoke to patient's daughter [name]. Gave lab results and instructions. She verbalized understanding.  Daughter states she has been having a hard time getting patient to eat. Patient does not like supplements such as ensure. States she will try her best to get patient to eat more.

## 2016-02-19 ENCOUNTER — Telehealth: Payer: Self-pay | Admitting: Family Medicine

## 2016-02-19 NOTE — Telephone Encounter (Signed)
Janett Billow from [facility] called and wanted to know if you received a CMM for pt's oxygen. Please advise, thank you!  Call @ [phone] ext [phone]

## 2016-02-19 NOTE — Telephone Encounter (Signed)
LM for Jessica to return call.

## 2016-02-23 NOTE — Progress Notes (Deleted)
CARDIOLOGY OFFICE NOTE  Date:  02/23/2016    Lisa Chan Date of Birth: 1926/11/04 Medical Record [id number]  PCP:  Tommi Rumps, MD  Cardiologist:  Servando Snare & ***    No chief complaint on file.   History of Present Illness: Lisa Chan is a 82 y.o. female who presents today for a *** Seen for Dr. Marlou Porch.   She has ongoing alcohol abuse and arthritis who was recently admitted following a mechanical fall c/b multiple rib fractures, pneumothorax, ABL anemia. Cardiology following for episodic tachycardia/possible PAF.  Her rhythm primarily appeared to be sinus rhythm with gradual trend towards sinus tach with PACs versus MAT. Higher rates seem to correlate during changes in position or activity. HR in 2014 was 93-101 so she may tend to run on higher range.  HR at rest are in the 80s-90s. It was suspected that her elevated HRs were due to deconditioning, anemia, agitation and possible component of alcohol withdrawal. There was a question of afib earlier in hospital stay but not felt to be true. Regardless even if felt to represent AF, she would not be a good candidate for anticoagulation given instability, fall risk, and ongoing alcohol abuse in the presence of recent ABL anemia. Her TSH high but free T4 normal. She does not have primary care.  She was to be managed conservatively. Looks like she is pretty resistant to taking medicines.   Comes back today. Here with daughter [name]. They both say that things are going ok. She feels better. She says she is "fine but the heat is enough to kill someone". Did have a spell last night where she felt hot - they turned the air down and she drank some water and she felt better. She has had some intermittent trouble swallowing her pills - has taken them consistently for the most part. Does better if she takes them with cantaloupe. She is back drinking alcohol - but less. Family has removed all the rugs in her home. She has been back to see  Dr. Grandville Silos and got a good report. They are establishing with a PCP next week. She has no chest pain. Not really short of breath. No palpitations. Not dizzy or lightheaded.   Comes in today. Here with   Past Medical History:  Diagnosis Date  . Alcohol abuse   . Alcohol intoxication delirium ([place])   . Arthritis   . Fall   . Hypertension   . Pleural effusion   . Rib fracture   . Shortness of breath dyspnea     Past Surgical History:  Procedure Laterality Date  . ABDOMINAL HYSTERECTOMY    . MASS EXCISION Right 01/01/2016   Procedure: EXCISION OF MASS OF RIGHT SHOULDER;  Surgeon: Georganna Skeans, MD;  Location: [facility];  Service: General;  Laterality: Right;  . THORACENTESIS     12-15-15 had 900cc removed     Medications: Current Outpatient Prescriptions  Medication Sig Dispense Refill  . metoprolol (LOPRESSOR) 100 MG tablet Take 1 tablet (100 mg total) by mouth 2 (two) times daily. (Patient taking differently: Take 100 mg by mouth daily. ) 60 tablet 11  . Multiple Vitamins-Minerals (MULTIVITAMIN WITH MINERALS) tablet Take 1 tablet by mouth daily.    . traMADol (ULTRAM) 50 MG tablet Take 1 tablet (50 mg total) by mouth every 6 (six) hours as needed for moderate pain or severe pain. (Patient not taking: Reported on 01/08/2016) 30 tablet 0   No current  facility-administered medications for this visit.     Allergies: No Known Allergies  Social History: The patient  reports that she has never smoked. She has never used smokeless tobacco. She reports that she drinks about 12.6 oz of alcohol per week . She reports that she does not use drugs.   Family History: The patient's ***family history includes Arrhythmia in her father and mother; Hypertension in her father and mother.   Review of Systems: Please see the history of present illness.   Otherwise, the review of systems is positive for {NONE DEFAULTED:18576::"none"}.   All other systems are reviewed and negative.     Physical Exam: VS:  There were no vitals taken for this visit. Marland Kitchen  BMI There is no height or weight on file to calculate BMI.  Wt Readings from Last 3 Encounters:  01/08/16 88 lb 6 oz (40.1 kg)  01/01/16 91 lb (41.3 kg)  11/27/15 89 lb 3.2 oz (40.5 kg)    General: Pleasant. Well developed, well nourished and in no acute distress.   HEENT: Normal.  Neck: Supple, no JVD, carotid bruits, or masses noted.  Cardiac: ***Regular rate and rhythm. No murmurs, rubs, or gallops. No edema.  Respiratory:  Lungs are clear to auscultation bilaterally with normal work of breathing.  GI: Soft and nontender.  MS: No deformity or atrophy. Gait and ROM intact.  Skin: Warm and dry. Color is normal.  Neuro:  Strength and sensation are intact and no gross focal deficits noted.  Psych: Alert, appropriate and with normal affect.   LABORATORY DATA:  EKG:  EKG {ACTION; IS/IS [id number] ordered today. This demonstrates ***.  Lab Results  Component Value Date   WBC 8.6 01/08/2016   HGB 11.8 (L) 01/08/2016   HCT 34.9 (L) 01/08/2016   PLT 229.0 01/08/2016   GLUCOSE 64 (L) 01/08/2016   ALT 9 01/08/2016   AST 24 01/08/2016   NA 140 01/08/2016   K 3.4 (L) 01/08/2016   CL 96 01/08/2016   CREATININE 0.54 01/08/2016   BUN 5 (L) 01/08/2016   CO2 31 01/08/2016   TSH 2.46 10/24/2015    BNP (last 3 results) No results for input(s): BNP in the last 8760 hours.  ProBNP (last 3 results) No results for input(s): PROBNP in the last 8760 hours.   Other Studies Reviewed Today:   Assessment/Plan: 1. Tachycardia - on both beta blocker and CCB therapy - EKG today shows improving HR. Looks to be back at her baseline. We will leave her on her current regimen. If she were to have AF - would not place on anticoagulation.   2. Alcohol abuse - she has cut back  3. Falls with rib fracture and associated blood loss anemia - recent visit with trauma MD. Rechecking her lab today  4. Advanced age.   5.  Elevated TSH - recheck today. She is establishing with PCP next week Current medicines are reviewed with the patient today.  The patient does not have concerns regarding medicines other than what has been noted above.  The following changes have been made:  See above.  Labs/ tests ordered today include:   No orders of the defined types were placed in this encounter.    Disposition:   FU with *** in {gen number [id number] {Days to years:10300}.   Patient is agreeable to this plan and will call if any problems develop in the interim.   Signed: Burtis Junes, RN, ANP-C 02/23/2016 4:02 PM  [place]  [address], [place]  [address] Phone: [phone] Fax: [phone]

## 2016-02-24 ENCOUNTER — Telehealth: Payer: Self-pay | Admitting: Family Medicine

## 2016-02-24 ENCOUNTER — Ambulatory Visit: Payer: Medicare PPO | Admitting: Nurse Practitioner

## 2016-02-24 NOTE — Telephone Encounter (Signed)
LM for Lisa Chan to refax the form for oxygen and clarify on the form what it needs to say.

## 2016-02-24 NOTE — Telephone Encounter (Signed)
Janett Billow from [facility] called back returning your call. In regards to oxygen, and needs a form filled out for the length of time the pt used the oxygen.Thank you!  Call [phone] ext [phone]

## 2016-03-02 ENCOUNTER — Encounter: Payer: Self-pay | Admitting: Nurse Practitioner

## 2016-03-02 NOTE — Telephone Encounter (Signed)
LM for for Loma Sousa that we need the formed re faxed because we do not have the form. We also need clarification due to the patient is not on oxygen any longer.

## 2016-03-02 NOTE — Telephone Encounter (Signed)
Courtney from [facility] called looking for the status of form. There is a deadline of the end of the week. Please advise, thank you!  Call Courtney @ [phone] ext [phone]

## 2016-03-04 ENCOUNTER — Telehealth: Payer: Self-pay | Admitting: Surgical

## 2016-03-04 NOTE — Telephone Encounter (Signed)
Spoke with patients daughter and patient is not on oxygen. She is doing wonderful

## 2016-03-04 NOTE — Telephone Encounter (Signed)
Patients daughter [name] requested a call at [phone]

## 2016-03-04 NOTE — Telephone Encounter (Signed)
Spoke with [facility] and the form was not filled out before. It was just signed. Please fill out form.

## 2016-03-04 NOTE — Telephone Encounter (Signed)
Left message for patients daughter to call the office back. Dr. Caryl Bis is wanting to see if the patient is still wanting the oxygen or see if she still was not using.

## 2016-03-04 NOTE — Telephone Encounter (Signed)
Noted. Thanks.

## 2016-03-11 ENCOUNTER — Telehealth: Payer: Self-pay | Admitting: *Deleted

## 2016-03-11 NOTE — Telephone Encounter (Signed)
Lisa Chan from [facility] requested to know if the CMS 484 for the patients oxygen form was finished. She received this form signed on 11/09 however there were  questions that needed to be completed. She stated that she will refax a copy to the office today  Contact [phone] ext [phone]

## 2016-03-11 NOTE — Telephone Encounter (Signed)
Dr.Sonnenberg patient. 

## 2016-03-12 NOTE — Telephone Encounter (Signed)
Forms are on your desk.

## 2016-03-15 NOTE — Telephone Encounter (Signed)
Faxed

## 2016-03-15 NOTE — Telephone Encounter (Signed)
Form completed. Given to [place].

## 2016-03-26 NOTE — Telephone Encounter (Signed)
Janett Billow from [facility], stated that the CMS 484 was faxed back to this office to be completed, she requested to know if the form has been updated  Please contact Janett Billow [phone] ext [phone]

## 2016-03-26 NOTE — Telephone Encounter (Signed)
Is this ready?

## 2016-03-29 NOTE — Telephone Encounter (Signed)
This should have been faxed a couple weeks ago. Will check with Roselyn Reef on Tuesday.

## 2016-03-30 NOTE — Telephone Encounter (Signed)
This has already been faxed. I will refax today.

## 2016-05-10 ENCOUNTER — Ambulatory Visit: Payer: Medicare PPO | Admitting: Family Medicine

## 2016-06-14 ENCOUNTER — Ambulatory Visit: Payer: Medicare PPO | Admitting: Family Medicine

## 2016-11-24 ENCOUNTER — Emergency Department
Admission: EM | Admit: 2016-11-24 | Discharge: 2016-11-24 | Disposition: A | Discharge status: Home / Self Care | Payer: Medicare PPO | Attending: Emergency Medicine | Admitting: Emergency Medicine

## 2016-11-24 ENCOUNTER — Emergency Department: Payer: Medicare PPO

## 2016-11-24 ENCOUNTER — Encounter: Payer: Self-pay | Admitting: Emergency Medicine

## 2016-11-24 DIAGNOSIS — S299XXA Unspecified injury of thorax, initial encounter: Secondary | ICD-10-CM | POA: Diagnosis not present

## 2016-11-24 DIAGNOSIS — Y939 Activity, unspecified: Secondary | ICD-10-CM | POA: Diagnosis not present

## 2016-11-24 DIAGNOSIS — S3282XA Multiple fractures of pelvis without disruption of pelvic ring, initial encounter for closed fracture: Secondary | ICD-10-CM | POA: Diagnosis not present

## 2016-11-24 DIAGNOSIS — W19XXXA Unspecified fall, initial encounter: Secondary | ICD-10-CM

## 2016-11-24 DIAGNOSIS — Z79899 Other long term (current) drug therapy: Secondary | ICD-10-CM | POA: Diagnosis not present

## 2016-11-24 DIAGNOSIS — S22070A Wedge compression fracture of T9-T10 vertebra, initial encounter for closed fracture: Secondary | ICD-10-CM | POA: Diagnosis not present

## 2016-11-24 DIAGNOSIS — S32512A Fracture of superior rim of left pubis, initial encounter for closed fracture: Secondary | ICD-10-CM | POA: Insufficient documentation

## 2016-11-24 DIAGNOSIS — R000 Tachycardia, unspecified: Secondary | ICD-10-CM

## 2016-11-24 DIAGNOSIS — S32592A Other specified fracture of left pubis, initial encounter for closed fracture: Secondary | ICD-10-CM | POA: Diagnosis not present

## 2016-11-24 DIAGNOSIS — N390 Urinary tract infection, site not specified: Secondary | ICD-10-CM | POA: Diagnosis not present

## 2016-11-24 DIAGNOSIS — S32810A Multiple fractures of pelvis with stable disruption of pelvic ring, initial encounter for closed fracture: Secondary | ICD-10-CM

## 2016-11-24 DIAGNOSIS — I10 Essential (primary) hypertension: Secondary | ICD-10-CM | POA: Diagnosis not present

## 2016-11-24 DIAGNOSIS — Y92009 Unspecified place in unspecified non-institutional (private) residence as the place of occurrence of the external cause: Secondary | ICD-10-CM | POA: Insufficient documentation

## 2016-11-24 DIAGNOSIS — Y999 Unspecified external cause status: Secondary | ICD-10-CM | POA: Diagnosis not present

## 2016-11-24 DIAGNOSIS — R102 Pelvic and perineal pain: Secondary | ICD-10-CM | POA: Diagnosis not present

## 2016-11-24 DIAGNOSIS — S3993XA Unspecified injury of pelvis, initial encounter: Secondary | ICD-10-CM | POA: Diagnosis not present

## 2016-11-24 LAB — URINALYSIS, COMPLETE (UACMP) WITH MICROSCOPIC
BILIRUBIN URINE: NEGATIVE
Glucose, UA: NEGATIVE mg/dL
KETONES UR: 5 mg/dL — AB
Nitrite: NEGATIVE
PH: 7 (ref 5.0–8.0)
PROTEIN: 30 mg/dL — AB
SPECIFIC GRAVITY, URINE: 1.004 — AB (ref 1.005–1.030)

## 2016-11-24 LAB — COMPREHENSIVE METABOLIC PANEL
ALBUMIN: 4.3 g/dL (ref 3.5–5.0)
ALT: 18 U/L (ref 14–54)
AST: 63 U/L — AB (ref 15–41)
Alkaline Phosphatase: 62 U/L (ref 38–126)
Anion gap: 13 (ref 5–15)
BILIRUBIN TOTAL: 1.6 mg/dL — AB (ref 0.3–1.2)
BUN: 12 mg/dL (ref 6–20)
CALCIUM: 9.3 mg/dL (ref 8.9–10.3)
CO2: 26 mmol/L (ref 22–32)
CREATININE: 0.58 mg/dL (ref 0.44–1.00)
Chloride: 97 mmol/L — ABNORMAL LOW (ref 101–111)
GFR calc Af Amer: >60 mL/min (ref >60)
GLUCOSE: 104 mg/dL — AB (ref 65–99)
POTASSIUM: 3.7 mmol/L (ref 3.5–5.1)
Sodium: 136 mmol/L (ref 135–145)
TOTAL PROTEIN: 7.5 g/dL (ref 6.5–8.1)

## 2016-11-24 LAB — CBC WITH DIFFERENTIAL/PLATELET
BASOS ABS: 0 10*3/uL (ref 0–0.1)
Basophils Relative: 0 %
Eosinophils Absolute: 0 10*3/uL (ref 0–0.7)
Eosinophils Relative: 0 %
HEMATOCRIT: 35.4 % (ref 35.0–47.0)
HEMOGLOBIN: 12.3 g/dL (ref 12.0–16.0)
LYMPHS PCT: 21 %
Lymphs Abs: 1.6 10*3/uL (ref 1.0–3.6)
MCH: 34.1 pg — ABNORMAL HIGH (ref 26.0–34.0)
MCHC: 34.9 g/dL (ref 32.0–36.0)
MCV: 97.6 fL (ref 80.0–100.0)
MONO ABS: 0.8 10*3/uL (ref 0.2–0.9)
MONOS PCT: 10 %
NEUTROS ABS: 5.3 10*3/uL (ref 1.4–6.5)
NEUTROS PCT: 69 %
Platelets: 198 10*3/uL (ref 150–440)
RBC: 3.62 MIL/uL — ABNORMAL LOW (ref 3.80–5.20)
RDW: 14.7 % — AB (ref 11.5–14.5)
WBC: 7.8 10*3/uL (ref 3.6–11.0)

## 2016-11-24 LAB — TROPONIN I

## 2016-11-24 LAB — ETHANOL

## 2016-11-24 MED ORDER — OXYCODONE-ACETAMINOPHEN 5-325 MG PO TABS: 1.0000 | ORAL_TABLET | Freq: Four times a day (QID) | ORAL | 0 refills | Status: DC | PRN

## 2016-11-24 MED ORDER — DILTIAZEM HCL 25 MG/5ML IV SOLN
10.0000 mg | Freq: Once | INTRAVENOUS | Status: AC
  Administered 2016-11-24: 10 mg via INTRAVENOUS
  Filled 2016-11-24: qty 5

## 2016-11-24 MED ORDER — SODIUM CHLORIDE 0.9 % IV SOLN
1000.0000 mL | Freq: Once | INTRAVENOUS | Status: AC
  Administered 2016-11-24: 1000 mL via INTRAVENOUS

## 2016-11-24 MED ORDER — METOPROLOL TARTRATE 50 MG PO TABS
100.0000 mg | ORAL_TABLET | Freq: Once | ORAL | Status: AC
  Administered 2016-11-24: 100 mg via ORAL
  Filled 2016-11-24: qty 2

## 2016-11-24 NOTE — ED Triage Notes (Signed)
Patient from home via [facility]. Patient lives by herself and family reports they found her in the floor. Patient not verbalizing pain but grimacing with movement. Patient's family reports she is non-compliant with home medications. HR upon arrival 160's. Patient denies CP or SOB. Alert and oriented x4.

## 2016-11-24 NOTE — ED Notes (Signed)
Placed pt on bed pan.

## 2016-11-24 NOTE — ED Provider Notes (Addendum)
[facility] Emergency Department Provider Note       Time seen: ----------------------------------------- 2:02 PM on 11/24/2016 -----------------------------------------     I have reviewed the triage vital signs and the nursing notes.   HISTORY   Chief Complaint Fall    HPI Lisa Chan is a 83 y.o. female who presents to the ED for a fall. Patient lives by herself and was found by her family in the floor. Patient does not have any specific pains but grimaces whenever she is moved. She was complaining of some pelvic pain earlier. Family reports she's noncompliant with her home medications and her heart rate is 160 on arrival. She denies any chest pain or difficulty breathing. Family reports she has 3 mixed drinks daily and she has a Social research officer, government.   Past Medical History:  Diagnosis Date  . Alcohol abuse   . Alcohol intoxication delirium ([place])   . Arthritis   . Fall   . Hypertension   . Pleural effusion   . Rib fracture   . Shortness of breath dyspnea     Patient Active Problem List   Diagnosis Date Noted  . 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 Bilateral lower extremity edema 01/08/2016  . Chronic respiratory failure ([place]) 11/28/2015  . Pleural effusion 11/28/2015  . Hypotension 11/28/2015  . Tachycardia 10/11/2015  . Fall 10/08/2015  . Acute blood loss anemia 10/08/2015  . Mass of skin of right shoulder 10/08/2015  . Alcohol abuse 10/08/2015  . Traumatic hemopneumothorax 10/08/2015  . Multiple rib fractures 10/04/2015    Past Surgical History:  Procedure Laterality Date  . ABDOMINAL HYSTERECTOMY    . MASS EXCISION Right 01/01/2016   Procedure: EXCISION OF MASS OF RIGHT SHOULDER;  Surgeon: Georganna Skeans, MD;  Location: [place];  Service: General;  Laterality: Right;  . THORACENTESIS     12-15-15 had 900cc removed    Allergies Patient has no known allergies.  Social History Social History  Substance Use Topics   . Smoking status: Never Smoker  . Smokeless tobacco: Never Used  . Alcohol use 12.6 oz/week    21 Shots of liquor per week     Comment: 3 liquor drinks daily    Review of Systems Constitutional: Negative for fever. Cardiovascular: Negative for chest pain. Respiratory: Negative for shortness of breath. Gastrointestinal: Negative for abdominal pain, vomiting and diarrhea. Genitourinary: Negative for dysuria. Musculoskeletal: Negative for back pain. Skin: Negative for rash. Neurological: Negative for headaches, focal weakness or numbness.  All systems negative/normal/unremarkable except as stated in the HPI  ____________________________________________   PHYSICAL EXAM:  VITAL SIGNS: ED Triage Vitals  Enc Vitals Group     BP 11/24/16 1359 (!) 175/116     Pulse Rate 11/24/16 1359 (!) 162     Resp 11/24/16 1359 (!) 22     Temp 11/24/16 1359 98.7 F (37.1 C)     Temp src --      SpO2 11/24/16 1359 95 %     Weight 11/24/16 1400 120 lb (54.4 kg)     Height 11/24/16 1400 5\' 2"  (1.575 m)     Head Circumference --      Peak Flow --      Pain Score 11/24/16 1355 0     Pain Loc --      Pain Edu? --      Excl. in [place]? --     Constitutional: Alert and oriented. Well appearing and in no distress. Eyes: Conjunctivae are  normal. Normal extraocular movements. ENT   Head: Normocephalic and atraumatic.   Nose: No congestion/rhinnorhea.   Mouth/Throat: Mucous membranes are moist.   Neck: No stridor. Cardiovascular: Rapid rate, regular rhythm. No murmurs, rubs, or gallops. Respiratory: Normal respiratory effort without tachypnea nor retractions. Breath sounds are clear and equal bilaterally. No wheezes/rales/rhonchi. Gastrointestinal: Soft and nontender. Normal bowel sounds Musculoskeletal: Nontender with normal range of motion in extremities. No lower extremity tenderness nor edema. Neurologic:  Normal speech and language. No gross focal neurologic deficits are  appreciated.  Skin:  Skin is warm, dry and intact. No rash noted. Psychiatric: Mood and affect are normal. Speech and behavior are normal.  ____________________________________________  EKG: Interpreted by me. Probable SVT with a rate of 165 bpm, normal QRS size, possible inferior and anterior infarct age indeterminate  Repeat EKG interpreted by me reveals sinus tachycardia with a rate of 116 bpm, normal PR interval, normal QRS, normal QT. Possible anterior infarct age indeterminate ____________________________________________  ED COURSE:  Pertinent labs & imaging results that were available during my care of the patient were reviewed by me and considered in my medical decision making (see chart for details). Patient presents for a fall and tachycardia, we will assess with labs and imaging as indicated. She will require IV fluids as well as IV Cardizem for rate control. It is unclear if this SVT or rapid A. fib.   Procedures ____________________________________________   LABS (pertinent positives/negatives)  Labs Reviewed  CBC WITH DIFFERENTIAL/PLATELET - Abnormal; Notable for the following:       Result Value   RBC 3.62 (*)    MCH 34.1 (*)    RDW 14.7 (*)    All other components within normal limits  COMPREHENSIVE METABOLIC PANEL - Abnormal; Notable for the following:    Chloride 97 (*)    Glucose, Bld 104 (*)    AST 63 (*)    Total Bilirubin 1.6 (*)    All other components within normal limits  URINALYSIS, COMPLETE (UACMP) WITH MICROSCOPIC - Abnormal; Notable for the following:    Color, Urine STRAW (*)    APPearance CLEAR (*)    Specific Gravity, Urine 1.004 (*)    Hgb urine dipstick LARGE (*)    Ketones, ur 5 (*)    Protein, ur 30 (*)    Leukocytes, UA TRACE (*)    Bacteria, UA RARE (*)    Squamous Epithelial / LPF 0-5 (*)    All other components within normal limits  TROPONIN I  ETHANOL   CRITICAL CARE Performed by: Earleen Newport   Total critical care  time: 30 minutes  Critical care time was exclusive of separately billable procedures and treating other patients.  Critical care was necessary to treat or prevent imminent or life-threatening deterioration.  Critical care was time spent personally by me on the following activities: development of treatment plan with patient and/or surrogate as well as nursing, discussions with consultants, evaluation of patient's response to treatment, examination of patient, obtaining history from patient or surrogate, ordering and performing treatments and interventions, ordering and review of laboratory studies, ordering and review of radiographic studies, pulse oximetry and re-evaluation of patient's condition.  RADIOLOGY  Pelvis x-rays, Chest x-ray, lumbar spine x-rays are pending  ____________________________________________  FINAL ASSESSMENT AND PLAN  Fall, tachycardia, UTI  Plan: Patient's labs and imaging were dictated above. Patient had presented for a fall at home. She was noted to be markedly tachycardic on arrival and received IV Cardizem and then  was restarted on her typical oral metoprolol dose. I suspect she may have an underlying pelvic fracture from her fall. Currently heart rate is much improved after our treatments and final disposition is pending at this time.   Earleen Newport, MD   Note: This note was generated in part or whole with voice recognition software. Voice recognition is usually quite accurate but there are transcription errors that can and very often do occur. I apologize for any typographical errors that were not detected and corrected.     Earleen Newport, MD 11/24/16 1439    Earleen Newport, MD 11/24/16 [phone]

## 2016-11-24 NOTE — ED Provider Notes (Signed)
-----------------------------------------   3:48 PM on 11/24/2016 -----------------------------------------  Most recent kG shows normal sinus rhythm rate of 82left axis no acute ST-T wave changes IMPRESSION: Remote compression fractures of T9, L1 and L5.  Compression deformities of P10 and L2 of uncertain age but new since chest CT and abdominal CT from 2017.   Electronically Signed   By: Marijo Sanes M.D.   On: 11/24/2016 15:25   IMPRESSION: Acute left-sided superior and inferior pubic rami fractures with underlying chronic healed fractures.  No hip or sacral fractures are identified.   Electronically Signed   By: Marijo Sanes M.D.   On: 11/24/2016 15:21     IMPRESSION: Remote posttraumatic changes involving both shoulders, left ribs and thoracic and lumbar spine.  No acute cardiopulmonary findings.   Electronically Signed   By: Marijo Sanes M.D.   On: 11/24/2016 15:27  IMPRESSION: Acute re- fracture through old healed superior and inferior rami fractures. See above. These are nondisplaced today.  Old L5 fracture.  No femur fracture seen.   Electronically Signed   By: Nelson Chimes M.D.   On: 11/24/2016 16:34  On exam patient has no chest wall tenrness shoulder tenderness arm tenderness rib tenderness or back tenderness on palpation up and down the entire C-spine. Patient does have some tenderness on palpation of the pubis and says when I push on the soles of her feet that her left knee hurts. There are no bruises on the left knee there is no swelling in the left knee there is no tenderness on palpation of the knee itself there is slight tenderness on palpation of the hip. This is why I got the pelvis CT but that only shows the pubic rami fractures. Patient insists that she wants to go home family is willing to try her at home. I will give her some pain medicine and let her try being at home. They know they can return if need be.     Nena Polio, MD 11/24/16 [phone]

## 2016-11-24 NOTE — ED Notes (Signed)
Patient placed on bedpan. Urine emptied into toilet. Patient repositioned in bed and covered with blanket.

## 2016-11-24 NOTE — Discharge Instructions (Signed)
Rest in bed. Use a walker to get around as tolerated. He can use the oxycodone one pill 4 times a day or up to 2 pills 4 times a day if needed for pain. Be careful to make you woozy and constipated. Drink plenty of fluids and use an over-the-counter laxative if need be Return here for any further problems or she cannot tolerate the pain. Call Dr. Christia Reading the orthopedic surgeon on call to arrange follow-up later

## 2016-11-30 ENCOUNTER — Telehealth: Payer: Self-pay | Admitting: Family Medicine

## 2016-11-30 NOTE — Telephone Encounter (Signed)
Patient should be seen in follow-up for evaluation prior to being given narcotics.

## 2016-11-30 NOTE — Telephone Encounter (Signed)
Patients daughter states she has some left over tramadol from the patient from a surgery last year and is going to let her try that.

## 2016-11-30 NOTE — Telephone Encounter (Signed)
Patients daughter states she can not move patient due to fracture, she would like to know what you recommend over the counter?

## 2016-11-30 NOTE — Telephone Encounter (Signed)
Noted  

## 2016-11-30 NOTE — Telephone Encounter (Signed)
Last seen 01/08/16

## 2016-11-30 NOTE — Telephone Encounter (Signed)
Patient seen in the ED on 8.22.18 from a fall. Acute left-sided superior and inferior pubic rami fractures with underlying chronic healed fractures.  Remote posttraumatic changes involving both shoulders, left ribs and thoracic and lumbar spine.  Per the patient's daughter she was given oxyCODONE-acetaminophen (ROXICET) 5-325 MG tablet  The patient has an appointment with Dr. Mack Guise on 9.10.18. The patient only has 2 pills left and her daughter is asking if Dr. Caryl Bis will give her a refill on her pain medication until her appointment. She only takes the medication at night.

## 2016-11-30 NOTE — Telephone Encounter (Signed)
Tylenol 1000 mg every 8 hours is really the only thing over-the-counter. They could attempt to get a sooner appointment with Dr. Mack Guise. We can help facilitate that if needed.

## 2017-03-08 ENCOUNTER — Ambulatory Visit (INDEPENDENT_AMBULATORY_CARE_PROVIDER_SITE_OTHER): Payer: Medicare PPO

## 2017-03-08 VITALS — BP 140/88 | HR 76 | Temp 98.1°F | Resp 14 | Ht <= 58 in | Wt 97.4 lb

## 2017-03-08 DIAGNOSIS — Z0000 Encounter for general adult medical examination without abnormal findings: Secondary | ICD-10-CM

## 2017-03-08 NOTE — Progress Notes (Signed)
Subjective:   Lisa Chan is a 83 y.o. female who presents for Medicare Annual (Subsequent) preventive examination.  Review of Systems:  Cardiac Risk Factors include: advanced age (>62men, >32 women);hypertension   No ROS.  Medicare Wellness Visit. Additional risk factors are reflected in the social history.     Objective:     Vitals: BP 140/88 (BP Location: Left Arm, Patient Position: Sitting, Cuff Size: Normal)   Pulse 76   Temp 98.1 F (36.7 C) (Oral)   Resp 14   Ht 4\' 10"  (1.473 m)   Wt 97 lb 6.4 oz (44.2 kg)   SpO2 91%   BMI 20.36 kg/m   Body mass index is 20.36 kg/m.  Advanced Directives 03/08/2017 11/24/2016 12/31/2015 12/04/2015 10/31/2015 10/05/2015 10/03/2015  Does Patient Have a Medical Advance Directive? Yes No No No No No No  Type of Paramedic of [place];Living will - - - - - -  Copy of [place] in Chart? No - copy requested - - - - - -  Would patient like information on creating a medical advance directive? - No - Patient declined - - - No - patient declined information No - patient declined information    Tobacco Social History   Tobacco Use  Smoking Status Never Smoker  Smokeless Tobacco Never Used     Counseling given: Not Answered   Clinical Intake:  Pre-visit preparation completed: Yes  Pain : No/denies pain     Nutritional Status: BMI of 19-24  Normal Diabetes: No  Activities of Daily Living: Independent Ambulation: Independent with device- listed below Home Assistive Devices/Equipment: Environmental consultant (specify Type), Wheelchair, Eyeglasses, Dentures (specify type) Medication Administration: Needs assistance (comment) Home Management: Needs assistance (comment)     Do you feel unsafe in your current relationship?: No Do you feel physically threatened by others?: No Anyone hurting you at home, work, or school?: No Unable to ask?: No Information provided on Community resources: No  How often do you  need to have someone help you when you read instructions, pamphlets, or other written materials from your doctor or pharmacy?: 1 - Never  Interpreter Needed?: No     Past Medical History:  Diagnosis Date  . Alcohol abuse   . Alcohol intoxication delirium ([place])   . Arthritis   . Fall   . Hypertension   . Pleural effusion   . Rib fracture   . Shortness of breath dyspnea    Past Surgical History:  Procedure Laterality Date  . ABDOMINAL HYSTERECTOMY    . MASS EXCISION Right 01/01/2016   Procedure: EXCISION OF MASS OF RIGHT SHOULDER;  Surgeon: Georganna Skeans, MD;  Location: [place];  Service: General;  Laterality: Right;  . THORACENTESIS     12-15-15 had 900cc removed   Family History  Problem Relation Age of Onset  . Hypertension Mother   . Arrhythmia Mother   . Hypertension Father   . Arrhythmia Father    Social History   Socioeconomic History  . Marital status: Widowed    Spouse name: None  . Number of children: None  . Years of education: None  . Highest education level: None  Social Needs  . Financial resource strain: None  . Food insecurity - worry: None  . Food insecurity - inability: None  . Transportation needs - medical: None  . Transportation needs - non-medical: None  Occupational History  . None  Tobacco Use  . Smoking status: Never Smoker  .  Smokeless tobacco: Never Used  Substance and Sexual Activity  . Alcohol use: Yes    Alcohol/week: 12.6 oz    Types: 21 Shots of liquor per week    Comment: 3 liquor drinks daily  . Drug use: No  . Sexual activity: None  Other Topics Concern  . None  Social History Narrative  . None    Outpatient Encounter Medications as of 03/08/2017  Medication Sig  . [DISCONTINUED] Multiple Vitamins-Minerals (MULTIVITAMIN WITH MINERALS) tablet Take 1 tablet by mouth daily.  . [DISCONTINUED] oxyCODONE-acetaminophen (ROXICET) 5-325 MG tablet Take 1 tablet by mouth every 6 (six) hours as needed.  .  [DISCONTINUED] traMADol (ULTRAM) 50 MG tablet Take 1 tablet (50 mg total) by mouth every 6 (six) hours as needed for moderate pain or severe pain.  . metoprolol (LOPRESSOR) 100 MG tablet Take 1 tablet (100 mg total) by mouth 2 (two) times daily. (Patient not taking: Reported on 03/08/2017)   No facility-administered encounter medications on file as of 03/08/2017.     Activities of Daily Living In your present state of health, do you have any difficulty performing the following activities: 03/08/2017  Hearing? N  Vision? N  Difficulty concentrating or making decisions? Y  Walking or climbing stairs? Y  Comment Unsteady gait  Dressing or bathing? N  Doing errands, shopping? Y  Preparing Food and eating ? Y  Using the Toilet? Y  In the past six months, have you accidently leaked urine? Y  Comment Manages with daily pad  Do you have problems with loss of bowel control? N  Managing your Medications? Y  Managing your Finances? Y  Housekeeping or managing your Housekeeping? Y  Some recent data might be hidden    Patient Care Team: Leone Haven, MD as PCP - General (Family Medicine)    Assessment:    This is a routine wellness examination for Lisa Chan. The goal of the wellness visit is to assist the patient how to close the gaps in care and create a preventative care plan for the patient.   The roster of all physicians providing medical care to patient is listed in the Snapshot section of the chart.  Osteoporosis risk reviewed.    Safety issues reviewed;  Lives alone.  Daughter lives next door. Life alert bracelet worn. Smoke and carbon monoxide detectors in the home. No firearms in the home.  Wears seatbelts when riding with others. Encouraged patient to wear sunscreen or protective clothing when in direct sunlight. No violence in the home.  Depression- PHQ 2 &9 complete.  No signs/symptoms or verbal communication regarding little pleasure in doing things, feeling down, depressed  or hopeless. No changes in sleeping, energy, eating, concentrating.  No thoughts of self harm or harm towards others.  Time spent on this topic is 8 minutes.   Patient is alert, normal appearance, oriented to person/place/and time. Displays appropriate judgement and can read correct time from watch face.   No new identified risk were noted.  Daughter assists as needed with  ADL's as needed.  Ambulates with walker/roller aid in the home.  Wheelchair in use when out of the home.  BMI- discussed the importance of a healthy diet, water intake and the benefits of aerobic exercise. Educational material provided.   24 hour diet recall: Breakfast: 2 eggs, toast, tomatoes, bacon, grits  Lunch: fruit, slice of cake Dinner: bbq sandwich  Snack: bananna Daily fluid intake: 2  cups of caffeine, 1 cups of water  Encouraged to  increase water intake.  Dental- dental  Eye- Visual acuity not assessed per patient preference. Wears corrective lenses when reading.  Sleep patterns- Sleeps 7-8 hours at night.  Wakes feeling rested.  Influenza, Pneumovax, TDAP vaccines discussed; declined.    Dexa Scan discussed; declined.    Follow up with PCP scheduled.  Exercise Activities and Dietary recommendations Current Exercise Habits: Home exercise routine, Type of exercise: walking, Time (Minutes): 15, Frequency (Times/Week): 3, Weekly Exercise (Minutes/Week): 45, Intensity: Mild  Goals    . INCREASE WATER INTAKE      Fall Risk Fall Risk  03/08/2017  Falls in the past year? Yes  Number falls in past yr: 1  Injury with Fall? Yes  Risk Factor Category  High Fall Risk  Follow up Falls prevention discussed;Education provided    Depression Screen PHQ 2/9 Scores 03/08/2017  PHQ - 2 Score 0  PHQ- 9 Score 0     Cognitive Function MMSE - Mini Mental State Exam 03/08/2017  Not completed: Refused     6CIT Screen 03/08/2017  What Year? 4 points  What month? 0 points  What time? 0 points  Count back  from 20 0 points  Months in reverse (No Data)     There is no immunization history on file for this patient. Screening Tests Health Maintenance  Topic Date Due  . TETANUS/TDAP  07/28/1945  . DEXA SCAN  07/27/1993  . PNA vac Low Risk Adult (1 of 2 - PCV13) 07/27/1993  . INFLUENZA VACCINE  11/03/2016      Plan:    End of life planning; Advance aging; Advanced directives discussed. Copy of current HCPOA/Living Will requested.    I have personally reviewed and noted the following in the patient's chart:   . Medical and social history . Use of alcohol, tobacco or illicit drugs  . Current medications and supplements . Functional ability and status . Nutritional status . Physical activity . Advanced directives . List of other physicians . Hospitalizations, surgeries, and ER visits in previous 12 months . Vitals . Screenings to include cognitive, depression, and falls . Referrals and appointments  In addition, I have reviewed and discussed with patient certain preventive protocols, quality metrics, and best practice recommendations. A written personalized care plan for preventive services as well as general preventive health recommendations were provided to patient.     Varney Biles, LPN  [id number]

## 2017-03-08 NOTE — Patient Instructions (Addendum)
  Ms. Pong , Thank you for taking time to come for your Medicare Wellness Visit. I appreciate your ongoing commitment to your health goals. Please review the following plan we discussed and let me know if I can assist you in the future.   These are the goals we discussed: Goals    . INCREASE WATER INTAKE       This is a list of the screening recommended for you and due dates:  Health Maintenance  Topic Date Due  . Tetanus Vaccine  07/28/1945  . DEXA scan (bone density measurement)  07/27/1993  . Pneumonia vaccines (1 of 2 - PCV13) 07/27/1993  . Flu Shot  11/03/2016

## 2017-04-15 ENCOUNTER — Ambulatory Visit (INDEPENDENT_AMBULATORY_CARE_PROVIDER_SITE_OTHER): Payer: Medicare PPO | Admitting: Family Medicine

## 2017-04-15 ENCOUNTER — Encounter: Payer: Self-pay | Admitting: Family Medicine

## 2017-04-15 ENCOUNTER — Other Ambulatory Visit: Payer: Self-pay

## 2017-04-15 VITALS — BP 130/80 | HR 84 | Temp 97.6°F | Wt 96.0 lb

## 2017-04-15 DIAGNOSIS — W19XXXA Unspecified fall, initial encounter: Secondary | ICD-10-CM

## 2017-04-15 DIAGNOSIS — R809 Proteinuria, unspecified: Secondary | ICD-10-CM | POA: Insufficient documentation

## 2017-04-15 DIAGNOSIS — R000 Tachycardia, unspecified: Secondary | ICD-10-CM | POA: Diagnosis not present

## 2017-04-15 DIAGNOSIS — R945 Abnormal results of liver function studies: Secondary | ICD-10-CM

## 2017-04-15 DIAGNOSIS — R7989 Other specified abnormal findings of blood chemistry: Secondary | ICD-10-CM

## 2017-04-15 DIAGNOSIS — F1010 Alcohol abuse, uncomplicated: Secondary | ICD-10-CM

## 2017-04-15 DIAGNOSIS — J9611 Chronic respiratory failure with hypoxia: Secondary | ICD-10-CM | POA: Diagnosis not present

## 2017-04-15 LAB — POCT URINALYSIS DIPSTICK
BILIRUBIN UA: NEGATIVE
GLUCOSE UA: NEGATIVE
Ketones, UA: NEGATIVE
Nitrite, UA: NEGATIVE
Protein, UA: NEGATIVE
SPEC GRAV UA: 1.01 (ref 1.010–1.025)
Urobilinogen, UA: 0.2 E.U./dL
pH, UA: 6 (ref 5.0–8.0)

## 2017-04-15 LAB — COMPREHENSIVE METABOLIC PANEL
ALBUMIN: 4 g/dL (ref 3.5–5.2)
ALK PHOS: 60 U/L (ref 39–117)
ALT: 5 U/L (ref 0–35)
AST: 15 U/L (ref 0–37)
BUN: 10 mg/dL (ref 6–23)
CALCIUM: 9.2 mg/dL (ref 8.4–10.5)
CO2: 31 mEq/L (ref 19–32)
Chloride: 100 mEq/L (ref 96–112)
Creatinine, Ser: 0.67 mg/dL (ref 0.40–1.20)
GFR: 88.14 mL/min (ref >60.00)
Glucose, Bld: 134 mg/dL — ABNORMAL HIGH (ref 70–99)
POTASSIUM: 3.6 meq/L (ref 3.5–5.1)
SODIUM: 140 meq/L (ref 135–145)
TOTAL PROTEIN: 7.2 g/dL (ref 6.0–8.3)
Total Bilirubin: 0.5 mg/dL (ref 0.2–1.2)

## 2017-04-15 LAB — URINALYSIS, MICROSCOPIC ONLY

## 2017-04-15 NOTE — Assessment & Plan Note (Signed)
Noted on urinalysis from ED.  We will recheck today.  She notes no urinary symptoms.

## 2017-04-15 NOTE — Progress Notes (Signed)
Lisa Rumps, MD Phone: [phone]  Lisa Chan is a 84 y.o. female who presents today for follow-up.  History of tachycardia.  Takes metoprolol whenever she feels like it.  She notes no chest pain, shortness of breath, or palpitations.  She was evaluated in the emergency room last August for a fall.  Family found her on the ground.  Diagnosed with refracture of her pelvis.  Also with possible new compression fractures at T12 and L2.  Remote history of compression fractures as well as rib fractures.  She has not fallen since August.  She did not pass out.  She notes no other injuries.  She notes no pain.  She feels well.  She lives on her own though her family does live in the house next to her.  She has a life alert bracelet.  She has been using a rolling walker since then.  LFTs were found to be slightly elevated.  She does drink 3 alcoholic beverages daily.  Her daughter has decreased the percentage alcohol vodka that she is buying for the patient.  Does eat well in the morning.  Has overall been doing quite well.  Social History   Tobacco Use  Smoking Status Never Smoker  Smokeless Tobacco Never Used     ROS see history of present illness  Objective  Physical Exam Vitals:   04/15/17 1134 04/15/17 1159  BP: 130/80   Pulse: (!) 106 84  Temp: 97.6 F (36.4 C)   SpO2: 91%     BP Readings from Last 3 Encounters:  04/15/17 130/80  03/08/17 140/88  11/24/16 (!) 166/98   Wt Readings from Last 3 Encounters:  04/15/17 96 lb (43.5 kg)  03/08/17 97 lb 6.4 oz (44.2 kg)  11/24/16 120 lb (54.4 kg)    Physical Exam  Constitutional: No distress.  Cardiovascular: Normal rate, regular rhythm and normal heart sounds.  Pulmonary/Chest: Effort normal and breath sounds normal.  Musculoskeletal: She exhibits no edema.  Neurological: She is alert. Gait normal.  Skin: Skin is warm and dry. She is not diaphoretic.     Assessment/Plan: Please see individual problem  list.  Fall Evaluated in the emergency room.  Refractured her pelvis.  Possible new compression fractures.  She is asymptomatic currently.  Discussed falls prevention.  Offered referral for physical therapy though she declined.  She will continue to use her rolling walker.  She will continue to wear her life alert bracelet.  Tachycardia Chronic issues with this.  Normal rhythm today.  Asymptomatic.  She will continue metoprolol.  Encouraged her to take this consistently.  Alcohol abuse Chronic issue.  AST was elevated.  Encouraged to decrease her alcohol intake.  Recheck LFTs.  Chronic respiratory failure ([place]) Patient was previously on oxygen.  She does not want to wear this ever again.    Proteinuria Noted on urinalysis from ED.  We will recheck today.  She notes no urinary symptoms.   Emerald was seen today for follow-up.  Diagnoses and all orders for this visit:  Proteinuria, unspecified type -     POCT Urinalysis Dipstick  Elevated LFTs -     Comp Met (CMET)  Fall, initial encounter  Tachycardia  Alcohol abuse  Chronic respiratory failure with hypoxia ([place])    Orders Placed This Encounter  Procedures  . Comp Met (CMET)  . POCT Urinalysis Dipstick    No orders of the defined types were placed in this encounter.    Lisa Rumps, MD [facility] -  [employer]

## 2017-04-15 NOTE — Assessment & Plan Note (Signed)
Chronic issue.  AST was elevated.  Encouraged to decrease her alcohol intake.  Recheck LFTs.

## 2017-04-15 NOTE — Patient Instructions (Signed)
Nice to see you. I am glad you are doing well. Please try to consistently take your metoprolol. Please try to decrease your alcohol intake. We will contact you with your lab results.

## 2017-04-15 NOTE — Assessment & Plan Note (Signed)
Patient was previously on oxygen.  She does not want to wear this ever again.

## 2017-04-15 NOTE — Assessment & Plan Note (Signed)
Chronic issues with this.  Normal rhythm today.  Asymptomatic.  She will continue metoprolol.  Encouraged her to take this consistently.

## 2017-04-15 NOTE — Assessment & Plan Note (Signed)
Evaluated in the emergency room.  Refractured her pelvis.  Possible new compression fractures.  She is asymptomatic currently.  Discussed falls prevention.  Offered referral for physical therapy though she declined.  She will continue to use her rolling walker.  She will continue to wear her life alert bracelet.

## 2017-04-15 NOTE — Addendum Note (Signed)
Addended by: Arby Barrette on: 04/15/2017 12:37 PM   Modules accepted: Orders

## 2017-04-18 LAB — URINE CULTURE
MICRO NUMBER:: 90046484
SPECIMEN QUALITY:: ADEQUATE

## 2017-04-20 ENCOUNTER — Other Ambulatory Visit: Payer: Self-pay | Admitting: Family Medicine

## 2017-04-23 ENCOUNTER — Other Ambulatory Visit: Payer: Self-pay | Admitting: Family Medicine

## 2017-04-23 MED ORDER — METOPROLOL TARTRATE 100 MG PO TABS: 100.0000 mg | ORAL_TABLET | Freq: Two times a day (BID) | ORAL | 11 refills | Status: DC

## 2018-02-08 ENCOUNTER — Ambulatory Visit: Payer: Self-pay

## 2018-02-08 NOTE — Telephone Encounter (Signed)
Sent to PCP pt is requesting to be seen today for acute visit for poss UTI and to do her 6 MONTH follow up appt.   Pt's daughter stated that her mother is really hard to get out of the house.   Sent to PCP

## 2018-02-08 NOTE — Telephone Encounter (Signed)
Returned call to daughter.  Reported she has noticed increased smell and concentration to pt's urine.  Denied that pt. Has any c/o pain with urination.  Denied any increase in frequency of urination.   Also, reported she has been more confused over past 1-2 weeks. Stated she has a "dazed stare" at times.  Stated she is unsure if she is dehydrated.  Denied noting any slurred speech, facial droop, or weakness of extremities.  Denied any c/o lightheadedness, increased weakness, or decreased urination.  Stated she has an unsteady gait, and uses a rollator walker.  Stated the pt. drinks coffee and Vodka, and does not eat much.  Reported the pt. has appt. on 11/8, and wanted to move her appt. up, if possible, to combine the 6 mo. F/u, and rule out a UTI.  Is requesting to only bring her to one appt.  Stated it is difficult to get her out of the house, to an appt., and will keep the appt. on Friday, if she cannot see Dr. Caryl Bis sooner.  Daughter questioned if she can have an order to do a urinalysis, and just bring in a sample for evaluation?  Called [facility] at [facility]; was advised that Dr. Caryl Bis will want to see the pt. before he orders a UA.  Advised that pt's daughter requested to only bring her in for one appt., rather than being eval. For UTI today, and seeing PCP for 6 mo. F/u on Friday.  Stated he will be at office this afternoon.  Will send Triage note for review/ recommendation.      Daughter made aware of the above plan; verb. Understanding; agreed.        Reason for Disposition . Bad or foul-smelling urine  Answer Assessment - Initial Assessment Questions 1. SYMPTOM: "What's the main symptom you're concerned about?" (e.g., frequency, incontinence)     Strong smell and concentrated color of urine and confusion 2. ONSET: "When did the symptoms start?"     Past 1-2 weeks has a "dazed stare" 3. PAIN: "Is there any pain?" If so, ask: "How bad is it?" (Scale: 1-10; mild, moderate,  severe)     Daughter stated she does not c/o pain with urination 4. CAUSE: "What do you think is causing the symptoms?"     Daughter thinks she may have a UTI 5. OTHER SYMPTOMS: "Do you have any other symptoms?" (e.g., fever, flank pain, blood in urine, pain with urination)     Strong smell and color of urine 6. PREGNANCY: "Is there any chance you are pregnant?" "When was your last menstrual period?"     N/A  Protocols used: URINARY [facility]  Message from Oneta Rack sent at 02/08/2018 8:41 AM EST   Summary: Urine Orders   Caller name: Ball,Angela Relation to pt: daughter  Call back number: [phone]  Pharmacy: [id number] - [place], [place] - [address] [phone] (Phone) [phone] (Fax)  Reason for call:  Daughter requesting urine orders due to patient experiencing confusion, heavy urine smell, concentrated color, for 2 weeks. Patient has a scheduled 6 month follow up for Friday 02/10/18 with PCP that she will keep. Patient PCP has no availability and doesn't want to schedule 2 appointments therefore requesting urine orders only, please advise.

## 2018-02-08 NOTE — Telephone Encounter (Signed)
I do not have an available appointment today. She does need to be evaluated for the for her urine and confusion today. It appears Lisa Chan has an available appointment this afternoon. The patient could also go to urgent care for evaluation for her urinary issues. I can see her for follow-up on Friday.

## 2018-02-08 NOTE — Telephone Encounter (Signed)
Called and spoke with pt's daughter she stated that she will just follow up with PCP on this issue on Friday. Pt's daughter stated that it is very difficulty to get her mother ready to leave to go anywhere.   Sent to PCP as an [place]

## 2018-02-08 NOTE — Telephone Encounter (Signed)
Noted.  We will plan on evaluating her on Friday.  If she develops any worsening symptoms she needs to be evaluated sooner.

## 2018-02-09 NOTE — Telephone Encounter (Signed)
Called patient's daughter and left a detailed VM that we will f/u with patient on Friday. If UTI symptoms get worse pt will need to be seen sooner at our office or go to a walk in clinic or urgent care.

## 2018-02-10 ENCOUNTER — Ambulatory Visit: Payer: Medicare PPO | Admitting: Family Medicine

## 2018-02-10 ENCOUNTER — Encounter: Payer: Self-pay | Admitting: Family Medicine

## 2018-02-10 VITALS — BP 140/80 | HR 135 | Temp 98.2°F | Ht <= 58 in | Wt 87.0 lb

## 2018-02-10 DIAGNOSIS — F1010 Alcohol abuse, uncomplicated: Secondary | ICD-10-CM

## 2018-02-10 DIAGNOSIS — I4891 Unspecified atrial fibrillation: Secondary | ICD-10-CM | POA: Diagnosis not present

## 2018-02-10 DIAGNOSIS — R630 Anorexia: Secondary | ICD-10-CM

## 2018-02-10 DIAGNOSIS — R000 Tachycardia, unspecified: Secondary | ICD-10-CM

## 2018-02-10 DIAGNOSIS — R350 Frequency of micturition: Secondary | ICD-10-CM | POA: Diagnosis not present

## 2018-02-10 DIAGNOSIS — W19XXXD Unspecified fall, subsequent encounter: Secondary | ICD-10-CM | POA: Diagnosis not present

## 2018-02-10 DIAGNOSIS — W19XXXA Unspecified fall, initial encounter: Secondary | ICD-10-CM

## 2018-02-10 DIAGNOSIS — N3001 Acute cystitis with hematuria: Secondary | ICD-10-CM | POA: Diagnosis not present

## 2018-02-10 LAB — POCT URINALYSIS DIPSTICK
Glucose, UA: NEGATIVE
Ketones, UA: 15
Nitrite, UA: NEGATIVE
PROTEIN UA: POSITIVE — AB
Spec Grav, UA: 1.015 (ref 1.010–1.025)
UROBILINOGEN UA: 4 U/dL — AB
pH, UA: 6.5 (ref 5.0–8.0)

## 2018-02-10 LAB — URINALYSIS, MICROSCOPIC ONLY

## 2018-02-10 MED ORDER — METOPROLOL TARTRATE 25 MG PO TABS: 25.0000 mg | ORAL_TABLET | Freq: Two times a day (BID) | ORAL | 2 refills | Status: DC

## 2018-02-10 MED ORDER — CEPHALEXIN 500 MG PO CAPS: 500.0000 mg | ORAL_CAPSULE | Freq: Two times a day (BID) | ORAL | 0 refills | Status: DC

## 2018-02-10 NOTE — Progress Notes (Signed)
Tommi Rumps, MD Phone: [phone]  Lisa Chan is a 84 y.o. female who presents today for follow-up.  Patient presents with her daughter who provides some of the history as well.  CC: UTI, falls, alcohol abuse, tachycardia, decreased appetite  UTI: Patient notes increased urgency and frequency recently.  No dysuria.  No fevers.  No abdominal pain.  No hematuria.  Previously had a UTI in January.  They report they completed antibiotics for this.  Falls: Patient has been using a rolling walker at her house which does help prevent some falls.  Typically if she does fall its when she goes to sit down or get in bed.  She will miss the chair and sit on her bottom.  She has missed the bed and hit her right cheek on the dresser.  That occurred 3 weeks ago.  Notes some scabs related to this though no pain or significant injury.  No loss of consciousness.  She is had no headaches.  No pain from this.  Last fall was a week ago where she sat down on her bottom.  Alcohol abuse: They report she continues to drink 4-5 alcoholic beverages daily.  This is less than previously.  Tachycardia: Always seems to be a little tachycardic per the patient's daughter when they come to the office.  She notes no palpitations.  She feels well overall.  Her daughter reports she takes her metoprolol 1 tablet maybe 3-4 times a week.  Decreased appetite: Patient's daughter notes she has not been eating as much recently.  She does eat a good breakfast though she will eat a little bit later in the day.  They try to get her to do Ensure though she will not drink it.  She notes no abdominal pain, blood in her stool, melena, itching, night sweats, anxiety or depression.  She notes she feels well overall.  She has lost some weight.  Social History   Tobacco Use  Smoking Status Never Smoker  Smokeless Tobacco Never Used     ROS see history of present illness  Objective  Physical Exam Vitals:   02/10/18 1414  BP:  140/80  Pulse: (!) 135  Temp: 98.2 F (36.8 C)  SpO2: 94%    BP Readings from Last 3 Encounters:  02/10/18 140/80  04/15/17 130/80  03/08/17 140/88   Wt Readings from Last 3 Encounters:  02/10/18 87 lb (39.5 kg)  04/15/17 96 lb (43.5 kg)  03/08/17 97 lb 6.4 oz (44.2 kg)    Physical Exam  Constitutional: No distress.  HENT:  Several well-healing scabs noted over her right cheek, there is no bony tenderness in her face or around her eyes  Cardiovascular: Normal heart sounds. An irregularly irregular rhythm present. Tachycardia present.  Pulmonary/Chest: Effort normal and breath sounds normal.  Abdominal: Soft. Bowel sounds are normal. She exhibits no distension. There is no tenderness. There is no rebound and no guarding.  Musculoskeletal: She exhibits no edema.  Neurological: She is alert.  CN 2-12 intact, 5/5 strength in bilateral biceps, triceps, grip, quads, hamstrings, plantar and dorsiflexion, sensation to light touch intact in bilateral UE and LE  Skin: Skin is warm and dry. She is not diaphoretic.   EKG: Atrial fibrillation, rate 136, baseline wander, artifact noted, nonspecific ST depression  Assessment/Plan: Please see individual problem list.  Alcohol abuse Discussed decreasing alcohol intake further.  Fall Continues to have issues with intermittent falls.  We will get home health involved to come out and evaluate  the patient and do physical therapy if the patient is willing to comply when they get there.  Discussed continuing to use her rolling walker.  Scabs appear to be well-healing.  No obvious other injuries.  She will monitor.  Atrial fibrillation ([place]) Noted to be in A. fib on exam.  EKG confirmed this.  I discussed with Murray Hodgkins of [facility] cardiology.  He reviewed the EKG and felt it did represent A. fib.  He noted he would get the patient set up with them in the office to follow along.  On review of prior cardiology notes he noted that the patient had  not been a anticoagulation candidate previously.  We discussed that the patient had continued to have intermittent falls and was continuing to abuse alcohol.  We both agreed that she is not an anticoagulation candidate at this time.  I will have her start on a lower dose of twice daily metoprolol given that she has not been taking her prior dose consistently.  She is given return precautions.  UTI (urinary tract infection) Symptoms and UA consistent with UTI.  We will treat with Keflex.  We will send urine for culture and microscopy.  Given return precautions.  Decreased appetite I suspect alcohol abuse is contributing to this.  We will check basic labs as outlined below.  We will have home health come evaluate her at home.  Could consider an appetite stimulant though the patient does not consistently take medications so that would be difficult.   Orders Placed This Encounter  Procedures  . Urine Culture  . Urine Microscopic Only  . TSH  . Comp Met (CMET)  . CBC w/Diff  . Ambulatory referral to Home Health    Referral Priority:   Routine    Referral Type:   Home Health Care    Referral Reason:   Specialty Services Required    Requested Specialty:   [place]    Number of Visits Requested:   1  . POCT Urinalysis Dipstick  . EKG 12-Lead    Meds ordered this encounter  Medications  . metoprolol tartrate (LOPRESSOR) 25 MG tablet    Sig: Take 1 tablet (25 mg total) by mouth 2 (two) times daily.    Dispense:  60 tablet    Refill:  2  . cephALEXin (KEFLEX) 500 MG capsule    Sig: Take 1 capsule (500 mg total) by mouth 2 (two) times daily.    Dispense:  14 capsule    Refill:  0     Tommi Rumps, MD [place]

## 2018-02-10 NOTE — Patient Instructions (Signed)
Nice to see you. We will treat your UTI with Keflex. We will check lab work today and contact you with the results. Please try to cut down on your alcoholic beverages per day. Please do not discontinue alcohol use all at once. We will get you to see cardiology as well. I have sent in metoprolol for you to start back on.  This will be at a lower dose to start with. If you develop abdominal pain, fevers, blood in your urine, chest pain, trouble breathing, or any new or changing symptoms please seek medical attention immediately.

## 2018-02-11 DIAGNOSIS — R630 Anorexia: Secondary | ICD-10-CM | POA: Insufficient documentation

## 2018-02-11 DIAGNOSIS — R350 Frequency of micturition: Secondary | ICD-10-CM | POA: Insufficient documentation

## 2018-02-11 DIAGNOSIS — N390 Urinary tract infection, site not specified: Secondary | ICD-10-CM | POA: Insufficient documentation

## 2018-02-11 LAB — CBC WITH DIFFERENTIAL/PLATELET
BASOS PCT: 0.5 %
Basophils Absolute: 31 cells/uL (ref 0–200)
EOS ABS: 49 {cells}/uL (ref 15–500)
EOS PCT: 0.8 %
HCT: 37.9 % (ref 35.0–45.0)
HEMOGLOBIN: 13.4 g/dL (ref 11.7–15.5)
Lymphs Abs: 1885 cells/uL (ref 850–3900)
MCH: 34.1 pg — ABNORMAL HIGH (ref 27.0–33.0)
MCHC: 35.4 g/dL (ref 32.0–36.0)
MCV: 96.4 fL (ref 80.0–100.0)
MONOS PCT: 9.5 %
MPV: 10.8 fL (ref 7.5–12.5)
NEUTROS ABS: 3556 {cells}/uL (ref 1500–7800)
Neutrophils Relative %: 58.3 %
Platelets: 305 10*3/uL (ref 140–400)
RBC: 3.93 10*6/uL (ref 3.80–5.10)
RDW: 12.5 % (ref 11.0–15.0)
Total Lymphocyte: 30.9 %
WBC mixed population: 580 cells/uL (ref 200–950)
WBC: 6.1 10*3/uL (ref 3.8–10.8)

## 2018-02-11 LAB — COMPREHENSIVE METABOLIC PANEL
AG Ratio: 1.4 (calc) (ref 1.0–2.5)
ALKALINE PHOSPHATASE (APISO): 76 U/L (ref 33–130)
ALT: 7 U/L (ref 6–29)
AST: 20 U/L (ref 10–35)
Albumin: 3.8 g/dL (ref 3.6–5.1)
BILIRUBIN TOTAL: 0.7 mg/dL (ref 0.2–1.2)
BUN/Creatinine Ratio: 8 (calc) (ref 6–22)
BUN: 6 mg/dL — AB (ref 7–25)
CO2: 25 mmol/L (ref 20–32)
CREATININE: 0.72 mg/dL (ref 0.60–0.88)
Calcium: 9.1 mg/dL (ref 8.6–10.4)
Chloride: 97 mmol/L — ABNORMAL LOW (ref 98–110)
Globulin: 2.8 g/dL (calc) (ref 1.9–3.7)
Glucose, Bld: 95 mg/dL (ref 65–99)
POTASSIUM: 4 mmol/L (ref 3.5–5.3)
Sodium: 139 mmol/L (ref 135–146)
Total Protein: 6.6 g/dL (ref 6.1–8.1)

## 2018-02-11 LAB — URINE CULTURE
MICRO NUMBER: 91348332
SPECIMEN QUALITY: ADEQUATE

## 2018-02-11 LAB — TSH: TSH: 3.98 m[IU]/L (ref 0.40–4.50)

## 2018-02-11 NOTE — Assessment & Plan Note (Signed)
Noted to be in A. fib on exam.  EKG confirmed this.  I discussed with Murray Hodgkins of [facility] cardiology.  He reviewed the EKG and felt it did represent A. fib.  He noted he would get the patient set up with them in the office to follow along.  On review of prior cardiology notes he noted that the patient had not been a anticoagulation candidate previously.  We discussed that the patient had continued to have intermittent falls and was continuing to abuse alcohol.  We both agreed that she is not an anticoagulation candidate at this time.  I will have her start on a lower dose of twice daily metoprolol given that she has not been taking her prior dose consistently.  She is given return precautions.

## 2018-02-11 NOTE — Assessment & Plan Note (Signed)
Symptoms and UA consistent with UTI.  We will treat with Keflex.  We will send urine for culture and microscopy.  Given return precautions.

## 2018-02-11 NOTE — Assessment & Plan Note (Signed)
Discussed decreasing alcohol intake further.

## 2018-02-11 NOTE — Assessment & Plan Note (Addendum)
I suspect alcohol abuse is contributing to this.  We will check basic labs as outlined below.  We will have home health come evaluate her at home.  Could consider an appetite stimulant though the patient does not consistently take medications so that would be difficult.

## 2018-02-11 NOTE — Assessment & Plan Note (Addendum)
Continues to have issues with intermittent falls.  We will get home health involved to come out and evaluate the patient and do physical therapy if the patient is willing to comply when they get there.  Discussed continuing to use her rolling walker.  Scabs appear to be well-healing.  No obvious other injuries.  She will monitor.

## 2018-02-28 ENCOUNTER — Ambulatory Visit: Payer: Medicare PPO | Admitting: Physician Assistant

## 2018-03-07 DIAGNOSIS — I10 Essential (primary) hypertension: Secondary | ICD-10-CM | POA: Diagnosis not present

## 2018-03-07 DIAGNOSIS — W19XXXD Unspecified fall, subsequent encounter: Secondary | ICD-10-CM | POA: Diagnosis not present

## 2018-03-07 DIAGNOSIS — F1010 Alcohol abuse, uncomplicated: Secondary | ICD-10-CM | POA: Diagnosis not present

## 2018-03-07 DIAGNOSIS — Z9181 History of falling: Secondary | ICD-10-CM | POA: Diagnosis not present

## 2018-03-07 DIAGNOSIS — Z87440 Personal history of urinary (tract) infections: Secondary | ICD-10-CM | POA: Diagnosis not present

## 2018-03-07 DIAGNOSIS — R000 Tachycardia, unspecified: Secondary | ICD-10-CM | POA: Diagnosis not present

## 2018-03-07 DIAGNOSIS — R630 Anorexia: Secondary | ICD-10-CM | POA: Diagnosis not present

## 2018-03-07 DIAGNOSIS — R296 Repeated falls: Secondary | ICD-10-CM | POA: Diagnosis not present

## 2018-03-07 DIAGNOSIS — I4891 Unspecified atrial fibrillation: Secondary | ICD-10-CM | POA: Diagnosis not present

## 2018-03-09 ENCOUNTER — Ambulatory Visit: Payer: Medicare PPO

## 2018-03-09 ENCOUNTER — Other Ambulatory Visit: Payer: Medicare PPO

## 2018-03-09 DIAGNOSIS — R350 Frequency of micturition: Secondary | ICD-10-CM

## 2018-03-09 NOTE — Addendum Note (Signed)
Addended by: Leeanne Rio on: 03/09/2018 04:00 PM   Modules accepted: Orders

## 2018-03-09 NOTE — Addendum Note (Signed)
Addended by: Leeanne [place] on: 03/09/2018 04:17 PM   Modules accepted: Orders

## 2018-03-14 DIAGNOSIS — F1010 Alcohol abuse, uncomplicated: Secondary | ICD-10-CM | POA: Diagnosis not present

## 2018-03-14 DIAGNOSIS — I10 Essential (primary) hypertension: Secondary | ICD-10-CM | POA: Diagnosis not present

## 2018-03-14 DIAGNOSIS — Z9181 History of falling: Secondary | ICD-10-CM | POA: Diagnosis not present

## 2018-03-14 DIAGNOSIS — I4891 Unspecified atrial fibrillation: Secondary | ICD-10-CM | POA: Diagnosis not present

## 2018-03-14 DIAGNOSIS — R296 Repeated falls: Secondary | ICD-10-CM | POA: Diagnosis not present

## 2018-03-14 DIAGNOSIS — R000 Tachycardia, unspecified: Secondary | ICD-10-CM | POA: Diagnosis not present

## 2018-03-14 DIAGNOSIS — W19XXXD Unspecified fall, subsequent encounter: Secondary | ICD-10-CM | POA: Diagnosis not present

## 2018-03-14 DIAGNOSIS — R630 Anorexia: Secondary | ICD-10-CM | POA: Diagnosis not present

## 2018-03-14 DIAGNOSIS — Z87440 Personal history of urinary (tract) infections: Secondary | ICD-10-CM | POA: Diagnosis not present

## 2018-03-22 DIAGNOSIS — W19XXXD Unspecified fall, subsequent encounter: Secondary | ICD-10-CM | POA: Diagnosis not present

## 2018-03-22 DIAGNOSIS — Z9181 History of falling: Secondary | ICD-10-CM | POA: Diagnosis not present

## 2018-03-22 DIAGNOSIS — F1010 Alcohol abuse, uncomplicated: Secondary | ICD-10-CM | POA: Diagnosis not present

## 2018-03-22 DIAGNOSIS — R630 Anorexia: Secondary | ICD-10-CM | POA: Diagnosis not present

## 2018-03-22 DIAGNOSIS — R296 Repeated falls: Secondary | ICD-10-CM | POA: Diagnosis not present

## 2018-03-22 DIAGNOSIS — I4891 Unspecified atrial fibrillation: Secondary | ICD-10-CM | POA: Diagnosis not present

## 2018-03-22 DIAGNOSIS — R000 Tachycardia, unspecified: Secondary | ICD-10-CM | POA: Diagnosis not present

## 2018-03-22 DIAGNOSIS — I10 Essential (primary) hypertension: Secondary | ICD-10-CM | POA: Diagnosis not present

## 2018-03-22 DIAGNOSIS — Z87440 Personal history of urinary (tract) infections: Secondary | ICD-10-CM | POA: Diagnosis not present

## 2018-03-30 DIAGNOSIS — I10 Essential (primary) hypertension: Secondary | ICD-10-CM | POA: Diagnosis not present

## 2018-03-30 DIAGNOSIS — Z87440 Personal history of urinary (tract) infections: Secondary | ICD-10-CM | POA: Diagnosis not present

## 2018-03-30 DIAGNOSIS — I4891 Unspecified atrial fibrillation: Secondary | ICD-10-CM | POA: Diagnosis not present

## 2018-03-30 DIAGNOSIS — F1010 Alcohol abuse, uncomplicated: Secondary | ICD-10-CM | POA: Diagnosis not present

## 2018-03-30 DIAGNOSIS — R296 Repeated falls: Secondary | ICD-10-CM | POA: Diagnosis not present

## 2018-03-30 DIAGNOSIS — Z9181 History of falling: Secondary | ICD-10-CM | POA: Diagnosis not present

## 2018-03-30 DIAGNOSIS — W19XXXD Unspecified fall, subsequent encounter: Secondary | ICD-10-CM | POA: Diagnosis not present

## 2018-03-30 DIAGNOSIS — R630 Anorexia: Secondary | ICD-10-CM | POA: Diagnosis not present

## 2018-03-30 DIAGNOSIS — R000 Tachycardia, unspecified: Secondary | ICD-10-CM | POA: Diagnosis not present

## 2018-04-06 DIAGNOSIS — I10 Essential (primary) hypertension: Secondary | ICD-10-CM | POA: Diagnosis not present

## 2018-04-06 DIAGNOSIS — F1010 Alcohol abuse, uncomplicated: Secondary | ICD-10-CM | POA: Diagnosis not present

## 2018-04-06 DIAGNOSIS — R630 Anorexia: Secondary | ICD-10-CM | POA: Diagnosis not present

## 2018-04-06 DIAGNOSIS — R000 Tachycardia, unspecified: Secondary | ICD-10-CM | POA: Diagnosis not present

## 2018-04-06 DIAGNOSIS — R296 Repeated falls: Secondary | ICD-10-CM | POA: Diagnosis not present

## 2018-04-06 DIAGNOSIS — I4891 Unspecified atrial fibrillation: Secondary | ICD-10-CM | POA: Diagnosis not present

## 2018-04-06 DIAGNOSIS — Z9181 History of falling: Secondary | ICD-10-CM | POA: Diagnosis not present

## 2018-04-06 DIAGNOSIS — W19XXXD Unspecified fall, subsequent encounter: Secondary | ICD-10-CM | POA: Diagnosis not present

## 2018-04-06 DIAGNOSIS — Z87440 Personal history of urinary (tract) infections: Secondary | ICD-10-CM | POA: Diagnosis not present

## 2018-05-02 ENCOUNTER — Ambulatory Visit: Payer: Medicare PPO

## 2018-05-02 ENCOUNTER — Ambulatory Visit: Payer: Medicare PPO | Admitting: Family Medicine

## 2018-05-02 ENCOUNTER — Encounter: Payer: Self-pay | Admitting: Family Medicine

## 2018-05-02 VITALS — BP 123/75 | HR 131 | Temp 97.0°F

## 2018-05-02 DIAGNOSIS — F1020 Alcohol dependence, uncomplicated: Secondary | ICD-10-CM | POA: Diagnosis not present

## 2018-05-02 DIAGNOSIS — C4499 Other specified malignant neoplasm of skin, unspecified: Secondary | ICD-10-CM | POA: Diagnosis not present

## 2018-05-02 DIAGNOSIS — R82998 Other abnormal findings in urine: Secondary | ICD-10-CM | POA: Diagnosis not present

## 2018-05-02 DIAGNOSIS — R410 Disorientation, unspecified: Secondary | ICD-10-CM | POA: Diagnosis not present

## 2018-05-02 DIAGNOSIS — I4891 Unspecified atrial fibrillation: Secondary | ICD-10-CM

## 2018-05-02 DIAGNOSIS — R8290 Unspecified abnormal findings in urine: Secondary | ICD-10-CM | POA: Diagnosis not present

## 2018-05-02 DIAGNOSIS — R809 Proteinuria, unspecified: Secondary | ICD-10-CM

## 2018-05-02 DIAGNOSIS — R413 Other amnesia: Secondary | ICD-10-CM | POA: Diagnosis not present

## 2018-05-02 DIAGNOSIS — W19XXXD Unspecified fall, subsequent encounter: Secondary | ICD-10-CM | POA: Diagnosis not present

## 2018-05-02 LAB — POCT URINALYSIS DIPSTICK
Glucose, UA: NEGATIVE
NITRITE UA: NEGATIVE
PROTEIN UA: POSITIVE — AB
Spec Grav, UA: 1.025 (ref 1.010–1.025)
Urobilinogen, UA: 2 E.U./dL — AB
pH, UA: 6 (ref 5.0–8.0)

## 2018-05-02 NOTE — Patient Instructions (Addendum)
Nice to see you. Please have lab work done today. If you develop any worsening symptoms please be evaluated immediately. We will get you to see cardiology. Please start on a B complex vitamin.

## 2018-05-03 LAB — URINALYSIS, MICROSCOPIC ONLY

## 2018-05-04 ENCOUNTER — Telehealth: Payer: Self-pay | Admitting: Radiology

## 2018-05-04 ENCOUNTER — Other Ambulatory Visit (INDEPENDENT_AMBULATORY_CARE_PROVIDER_SITE_OTHER): Payer: Medicare PPO

## 2018-05-04 DIAGNOSIS — R413 Other amnesia: Secondary | ICD-10-CM | POA: Insufficient documentation

## 2018-05-04 DIAGNOSIS — R410 Disorientation, unspecified: Secondary | ICD-10-CM | POA: Diagnosis not present

## 2018-05-04 LAB — COMPREHENSIVE METABOLIC PANEL
ALT: 7 U/L (ref 0–35)
AST: 17 U/L (ref 0–37)
Albumin: 3.3 g/dL — ABNORMAL LOW (ref 3.5–5.2)
Alkaline Phosphatase: 54 U/L (ref 39–117)
BILIRUBIN TOTAL: 0.9 mg/dL (ref 0.2–1.2)
BUN: 17 mg/dL (ref 6–23)
CO2: 24 mEq/L (ref 19–32)
Calcium: 8.8 mg/dL (ref 8.4–10.5)
Chloride: 98 mEq/L (ref 96–112)
Creatinine, Ser: 0.56 mg/dL (ref 0.40–1.20)
GFR: 101.75 mL/min (ref >60.00)
GLUCOSE: 121 mg/dL — AB (ref 70–99)
Potassium: 3.5 mEq/L (ref 3.5–5.1)
SODIUM: 136 meq/L (ref 135–145)
Total Protein: 6.4 g/dL (ref 6.0–8.3)

## 2018-05-04 LAB — CBC WITH DIFFERENTIAL/PLATELET
BASOS ABS: 0 10*3/uL (ref 0.0–0.1)
Basophils Relative: 0.5 % (ref 0.0–3.0)
Eosinophils Absolute: 0 10*3/uL (ref 0.0–0.7)
Eosinophils Relative: 0.7 % (ref 0.0–5.0)
HCT: 40.6 % (ref 36.0–46.0)
Hemoglobin: 13.3 g/dL (ref 12.0–15.0)
Lymphocytes Relative: 26.1 % (ref 12.0–46.0)
Lymphs Abs: 1.7 10*3/uL (ref 0.7–4.0)
MCHC: 32.7 g/dL (ref 30.0–36.0)
MCV: 99 fl (ref 78.0–100.0)
Monocytes Absolute: 0.4 10*3/uL (ref 0.1–1.0)
Monocytes Relative: 6.6 % (ref 3.0–12.0)
Neutro Abs: 4.3 10*3/uL (ref 1.4–7.7)
Neutrophils Relative %: 66.1 % (ref 43.0–77.0)
Platelets: 211 10*3/uL (ref 150.0–400.0)
RBC: 4.1 Mil/uL (ref 3.87–5.11)
RDW: 14.3 % (ref 11.5–15.5)
WBC: 6.6 10*3/uL (ref 4.0–10.5)

## 2018-05-04 LAB — URINE CULTURE
MICRO NUMBER:: 115601
SPECIMEN QUALITY: ADEQUATE

## 2018-05-04 LAB — VITAMIN B12: Vitamin B-12: 209 pg/mL — ABNORMAL LOW (ref 211–911)

## 2018-05-04 LAB — TSH: TSH: 4.1 u[IU]/mL (ref 0.35–4.50)

## 2018-05-04 NOTE — Assessment & Plan Note (Signed)
No apparent recurrence 

## 2018-05-04 NOTE — Telephone Encounter (Signed)
Noted.  Please contact the patient's daughter and see if we can have her return for additional lab work to draw blood for a vitamin B1 check.  There was not enough blood to check this.  I discussed my previous concerns with them previously regarding vitamin B1 deficiency causing her symptoms and the need for IV supplementation.  If they do not want to come back in for a B1 check I would recommend emergency department evaluation for her weakness and confusion in the setting of alcohol abuse with concern for thiamine deficiency.

## 2018-05-04 NOTE — Telephone Encounter (Signed)
Left detailed message top return call to office concerning patient [name]) . PEC nurse may advise .

## 2018-05-04 NOTE — Assessment & Plan Note (Signed)
She continues to have issues with this.  I suspect this is related to weakness in addition to her alcohol use.  No longer has home health.  We will see if we can get palliative care involved.

## 2018-05-04 NOTE — Telephone Encounter (Signed)
Call rec'd from daughter.  She stated it is very difficult, from a transportation standpoint to get pt. Back to the office.  Stated she will scheduled a Cardiology appt., and then coordinate coming to Dr. Ellen Henri office to get the lab work done.  Will make Dr. Caryl Bis aware of the plan.

## 2018-05-04 NOTE — Assessment & Plan Note (Signed)
I am concerned that this may be related to her alcohol abuse and certainly there could be concern for Wernicke's encephalopathy.  I discussed this with the patient and her daughter.  I discussed the dangerousness of this and the fact that it could progress.  Discussed this is related to a thiamine deficiency and can been seen in alcohol abusers.  Discussed that my preference would be for them to go to the hospital for evaluation to consider IV thiamine versus other treatment.  The patient declined this and the daughter who notes that she has healthcare power of attorney declined this as well.  She noted that they would go to the hospital if her thiamine was low on lab work.  Their main concern is keeping the patient comfortable.  We will check lab work.  We will have palliative care involved.  I do think the patient would likely qualify for hospice.  Advised if anything worsens to any degree they need to go to the hospital.

## 2018-05-04 NOTE — Progress Notes (Signed)
Tommi Rumps, MD Phone: [phone]  Lisa Chan is a 85 y.o. female who presents today for follow-up.  CC: A. fib, falls, memory difficulty, proteinuria  Patient stopped taking metoprolol.  Her daughter noticed that she would be out of it when taking this and questioned whether or not she was seeing things while on it.  They stopped it 6 weeks ago and she has not had any issues since then.  She does not feel any palpitations.  No chest pain or shortness of breath.  She did not see cardiology.  Patient's daughter reports not eating much.  She will have half an egg and tomato.  She has cut down on her alcohol intake to 2-3 vodkas per day.  She is sleeping up to 20 hours a day.  She has not had any fever or cough.  No dysuria.  No abdominal pain.  Notes having good bowel movements.  The patient just does not want to get up and move around.  Seems weak when she gets up.  Seems to have memory issues with some confusion.  She is oriented to person and place though not the year.  She does feed herself though does not go to the bathroom on her own, bathe herself on her own, or cook for herself.  They note no hematuria, urinary frequency, or frothy urine.  She had home health for about 6 weeks though her daughter reports that they were not able to help much.  She did not end up doing physical therapy as her daughter felt she would not cooperate.  Last fall was a week ago with no injury.  No loss of consciousness.  No head injury.  Social History   Tobacco Use  Smoking Status Never Smoker  Smokeless Tobacco Never Used     ROS see history of present illness  Objective  Physical Exam Vitals:   05/02/18 1337  BP: 123/75  Pulse: (!) 131  Temp: (!) 97 F (36.1 C)  SpO2: 97%    BP Readings from Last 3 Encounters:  05/02/18 123/75  02/10/18 140/80  04/15/17 130/80   Wt Readings from Last 3 Encounters:  02/10/18 87 lb (39.5 kg)  04/15/17 96 lb (43.5 kg)  03/08/17 97 lb 6.4 oz (44.2 kg)     Physical Exam Constitutional:      General: She is not in acute distress.    Appearance: She is not diaphoretic.     Comments: Thin female, chronically ill-appearing  HENT:     Head: Normocephalic.     Mouth/Throat:     Pharynx: Oropharynx is clear.  Eyes:     Conjunctiva/sclera: Conjunctivae normal.     Pupils: Pupils are equal, round, and reactive to light.  Cardiovascular:     Rate and Rhythm: Normal rate and regular rhythm.     Heart sounds: Normal heart sounds.  Pulmonary:     Effort: Pulmonary effort is normal.     Breath sounds: Normal breath sounds.  Abdominal:     General: Bowel sounds are normal. There is no distension.     Palpations: Abdomen is soft.     Tenderness: There is no abdominal tenderness.  Musculoskeletal:     Right lower leg: No edema.     Left lower leg: No edema.  Skin:    General: Skin is warm and dry.     Comments: Right shoulder without any mass lesion or abnormal skin lesions noted  Neurological:     Mental Status: She  is alert.     Comments: Visual field testing attempted though it was difficult to tell if the patient was being truthful in her answers and being able to see fingers as she was able to occasionally get the number of fingers held up correct, CN 3-12 intact, 5/5 strength in bilateral biceps, triceps, grip, quads, hamstrings, plantar and dorsiflexion, sensation to light touch intact in bilateral UE and LE, patient examined in wheelchair      Assessment/Plan: Please see individual problem list.  Atrial fibrillation ([place]) Rate is increased.  This is likely related to not being on medication.  We will discuss with our clinical pharmacist the best medication given the possible side effects the patient had with metoprolol.  Refer to cardiology.  Proteinuria Recheck urine.  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  Fall She continues to have issues with this.  I suspect this is related to weakness in addition to her  alcohol use.  No longer has home health.  We will see if we can get palliative care involved.  Memory difficulty I am concerned that this may be related to her alcohol abuse and certainly there could be concern for Wernicke's encephalopathy.  I discussed this with the patient and her daughter.  I discussed the dangerousness of this and the fact that it could progress.  Discussed this is related to a thiamine deficiency and can been seen in alcohol abusers.  Discussed that my preference would be for them to go to the hospital for evaluation to consider IV thiamine versus other treatment.  The patient declined this and the daughter who notes that she has healthcare power of attorney declined this as well.  She noted that they would go to the hospital if her thiamine was low on lab work.  Their main concern is keeping the patient comfortable.  We will check lab work.  We will have palliative care involved.  I do think the patient would likely qualify for hospice.  Advised if anything worsens to any degree they need to go to the hospital.   Orders Placed This Encounter  Procedures  . Urine Culture  . Urine Microscopic Only  . B12    Standing Status:   Future    Standing Expiration Date:   05/03/2019  . CBC w/Diff    Standing Status:   Future    Standing Expiration Date:   05/03/2019  . Comp Met (CMET)    Standing Status:   Future    Standing Expiration Date:   05/03/2019  . TSH    Standing Status:   Future    Standing Expiration Date:   05/03/2019  . Vitamin B1    Standing Status:   Future    Standing Expiration Date:   05/03/2019  . Amb Referral to Palliative Care    Referral Priority:   Routine    Referral Type:   Consultation    Number of Visits Requested:   1  . Ambulatory referral to Cardiology    Referral Priority:   Routine    Referral Type:   Consultation    Referral Reason:   Specialty Services Required    Requested Specialty:   Cardiology    Number of Visits Requested:   1  . POC  Urinalysis Dipstick    No orders of the defined types were placed in this encounter.    Tommi Rumps, MD [place]

## 2018-05-04 NOTE — Assessment & Plan Note (Signed)
Recheck urine

## 2018-05-04 NOTE — Assessment & Plan Note (Signed)
Rate is increased.  This is likely related to not being on medication.  We will discuss with our clinical pharmacist the best medication given the possible side effects the patient had with metoprolol.  Refer to cardiology.

## 2018-05-04 NOTE — Telephone Encounter (Signed)
Pt came in for labs today. This was pt second attempt. Not enough blood to run Vitamin B1.

## 2018-05-05 ENCOUNTER — Telehealth: Payer: Self-pay | Admitting: Family Medicine

## 2018-05-05 MED ORDER — ATENOLOL 25 MG PO TABS: 25.0000 mg | ORAL_TABLET | Freq: Every day | ORAL | 3 refills | Status: AC

## 2018-05-05 NOTE — Telephone Encounter (Signed)
-----   Message from De Hollingshead, [facility] sent at 05/05/2018  2:40 PM EST ----- Hi -  Agreed, I haven't ever heard of this, but dropping her BP could be related. I think I'd recommend trying an alternative beta blocker; the atrial fibrillation article on up to date mentions that atenolol may have a lower incidence of CNS side effects. I'd try atenolol 25 mg once daily, and see how she responds.   Catie  ----- Message ----- From: Leone Haven, MD Sent: 05/04/2018   8:55 AM EST To: De Hollingshead, [facility]  Hey Catie,   This patient has afib and has been off of her metoprolol due to reportedly "being out of it and possibly seeing things" when taking the metoprolol. Have you ever heard of anyone seeing things on metoprolol? I suspect the being out of it could be related to her BP dropping low, though wanted to see what you thought. What would be a good alternative if those things are related to the metoprolol? A lower dose of metoprolol? A different beta blocker? A calcium channel blocker? Thanks for your help.  Randall Hiss

## 2018-05-05 NOTE — Telephone Encounter (Signed)
Daughter notified. Lisa Chan wanted to let you know that her cardiology appt isn't until April 6th with Dr. Rockey Situ at  [facility] . And she was trying to do her labs around the same time. If there is anyway to get her in any sooner please let her know. In the meantime, she will try to get her in for labs sooner. She was just trying to make one trip.

## 2018-05-05 NOTE — Telephone Encounter (Signed)
Please let the patient's daughter know that I received a message back from our clinical pharmacist regarding the best option for heart rate control medication for her atrial fibrillation.  I have sent in atenolol for her to try.  If she has any adverse effects from this they should let us know.  Thanks.

## 2018-05-07 NOTE — Telephone Encounter (Signed)
I will forward to [facility] to see if we can arrange for a sooner appointment.

## 2018-05-07 NOTE — Telephone Encounter (Signed)
Noted. Please follow-up with the patient this week to see when she can come in for labs.

## 2018-05-11 ENCOUNTER — Other Ambulatory Visit: Payer: Medicare PPO | Admitting: Student

## 2018-05-11 ENCOUNTER — Encounter: Payer: Self-pay | Admitting: *Deleted

## 2018-05-11 ENCOUNTER — Telehealth: Payer: Self-pay

## 2018-05-11 ENCOUNTER — Telehealth: Payer: Self-pay | Admitting: Family Medicine

## 2018-05-11 DIAGNOSIS — Z515 Encounter for palliative care: Secondary | ICD-10-CM

## 2018-05-11 NOTE — Telephone Encounter (Signed)
LMTCB

## 2018-05-11 NOTE — Telephone Encounter (Signed)
Copied from [place] [id number]. Topic: Quick Communication - See Telephone Encounter >> May 11, 2018 12:34 PM Berneta Levins wrote: CRM for notification. See Telephone encounter for: 05/11/18.  Deborah from [place] [facility] [employer].  States that pt has been in their palliative care program but now they are requesting evaluation for hospice care and wants to know if Dr. Caryl Bis is in agreement. Neoma Laming can be reached at [phone]

## 2018-05-11 NOTE — Telephone Encounter (Signed)
I spoke with Neoma Laming from [facility] & she stated that Dr. Gilford Rile from was going to handle patient's case since patient's daughter thought that matters could not wait. There was another message forwarded to you earlier. This is just a FYI.

## 2018-05-11 NOTE — Progress Notes (Signed)
[facility] Telephone: [phone] Fax: [phone]  PATIENT NAME: Lisa Chan DOB: [birth year] MRN: [id number]  PRIMARY CARE PROVIDER:   Leone Haven, MD  REFERRING PROVIDER:  Leone Haven, MD [address] [place], [place] [address]  RESPONSIBLE PARTY: Daughter, Lisa Chan    ASSESSMENT:  Lisa Chan is resting in bed upon arrival; she arouses to stimulation. Daughter Lisa Chan is present. Lisa Chan does answer some direct questions. She has left periorbital edema due to lying on her side. No acute distress noted. Tachycardia noted; she has not taken her atenolol yet today. Discussed role of Palliative Medicine. We discussed goals of care. We discussed disease process and recent declines. We discussed Home health versus Hospice services. Daughter Lisa Chan agrees with Hospice evaluation.    RECOMMENDATIONS and PLAN:  1. Code Status: DNR. 2. Medical goals of therapy: patient would like to remain in the home; she does not want to go to the hospital. She is being referred for Hospice evaluation due to recent declines. 3. Symptom management: pain-acetaminophen 650mg  PO every 6 hors prn recommended. Tachycardia-daughter is encouraged to give her atenolol 25mg  daily as ordered. 4. Discharge planning: patient will continue to reside at home. 5. Emotional/spiritual support: Discussed with Lisa Chan and daughter Lisa Chan; she is encouraged to call with questions.   Palliative Medicine will continue to follow through admission to Hospice services.   I spent 60 minutes providing this consultation,  from 10:00am to 11:00am. More than 50% of the time in this consultation was spent coordinating communication.   HISTORY OF PRESENT ILLNESS:  Lisa Chan is a 85 y.o.  female with multiple medical problems including atrial fibrillation, impaired memory, proteinuria; multiple rib fractures 2017, pelvic fracture 2018. History of falls;  last fall about a month ago. Falls have decreased per daughter since she has been less mobile. Daughter Lisa Chan reports patient starting to decline two weeks ago, but she has had major decline since being seen by Dr. Caryl Bis last week. She has not gotten out of the bed in two days and is very weak. She was previously using rollator for ambulation up until two weeks ago. All adl care needs are performed by her daughter. She does have a pendant to alert her daughter. Patient denies pain at present, but yells out in pain with any turning or repositioning. She is sleeping more; now sleeping 20 hours a day. She would previously be awake throughout the day. She was unable to stand at last doctor's visit. Her weight in November 2019 was 87 pounds; Lisa Chan reports poor appetite and patient losing at least five more pounds since then. She reports maybe a 10-12 pound loss in the past six months. She is drinking maybe 1/4 of a Boost nutritional supplement a day in addition to  egg,  piece toast, couple bites of tomato for breakfast, bites of banana throughout the day. Other meals are "hit or miss." She received home health in December/January, but daughter states she was not able to participate with physical therapy. Patient does continue to drink vodka, now down to one drink a day per daughter. She has had worsening confusion/forgetfulness. She is unable to recall year or president; she was able to state she is in [place]. ? Thiamine deficiency; due to being dehydrated, lab was unable to collect enough blood to draw specimen to check thiamine level. She is more withdrawn, no longer watching television or engaging in conversation. She has had worsening  incontinence in past two weeks, now wearing pull- ups. Previously she was continent. Daughter expresses need for additional care/assistance in the home. Lisa Chan husband was a [place] [employer] [place]. Palliative Care was asked to help address goals of care with follow up  visit today.  CODE STATUS: DNR  PPS: 30% HOSPICE ELIGIBILITY/DIAGNOSIS: Yes, Protein Calorie Malnutrition  PAST MEDICAL HISTORY:  Past Medical History:  Diagnosis Date  . Alcohol abuse   . Alcohol intoxication delirium ([place])   . Arthritis   . Fall   . Hypertension   . Pleural effusion   . Rib fracture   . Shortness of breath dyspnea     SOCIAL HX:  Social History   Tobacco Use  . Smoking status: Never Smoker  . Smokeless tobacco: Never Used  Substance Use Topics  . Alcohol use: Yes    Alcohol/week: 21.0 standard drinks    Types: 21 Shots of liquor per week    Comment: 3 liquor drinks daily    ALLERGIES: No Known Allergies   PERTINENT MEDICATIONS:  Outpatient Encounter Medications as of 05/11/2018  Medication Sig  . atenolol (TENORMIN) 25 MG tablet Take 1 tablet (25 mg total) by mouth daily.  Marland Kitchen b complex vitamins tablet Take 1 tablet by mouth daily.  Marland Kitchen co-enzyme Q-10 30 MG capsule Take 30 mg by mouth 3 (three) times daily.  . Multiple Vitamins-Minerals (ADULT GUMMY PO) Take 1 Dose by mouth.   No facility-administered encounter medications on file as of 05/11/2018.     PHYSICAL EXAM:   General: NAD, frail appearing, ill appearing, very thin Cardiovascular: irregular rate, rhythm Pulmonary: clear ant fields Abdomen: soft, nontender, + bowel sounds GU: no suprapubic tenderness Extremities: mild pedal edema, no joint deformities Skin: no rashes Neurological: Weakness but otherwise nonfocal  Ezekiel Slocumb, NP

## 2018-05-11 NOTE — Telephone Encounter (Signed)
I called & spoke with Neoma Laming to let her know that Dr. Caryl Bis was out of the office until 05/15/18. I asked if it would be ok to wait on his response. She wanted to speak to PA at [facility] to make sure that was ok. She will call back & ask to speak with me.

## 2018-05-11 NOTE — Telephone Encounter (Signed)
Neoma Laming calling back to speak with Judson Roch. Judson Roch currently unavailable. Please advise.

## 2018-05-15 NOTE — Telephone Encounter (Signed)
Noted.  See other phone note.

## 2018-05-15 NOTE — Telephone Encounter (Signed)
Left message to call back  

## 2018-05-15 NOTE — Telephone Encounter (Signed)
Noted. I am in agreement with hospice for this patient.

## 2018-05-21 IMAGING — CT CT ANGIO CHEST
2 of 6 series · 19 of 46 positions shown · IV contrast (isovue)
Comparison: 10/09/2015.  Chest CT 10/03/2015.

CLINICAL DATA: Labored breathing. Recent fall with rib fractures.
Shortness of breath.

EXAM:
CT ANGIOGRAPHY CHEST WITH CONTRAST
TECHNIQUE: Multidetector CT imaging of the chest was performed using the
standard protocol during bolus administration of intravenous
contrast. Multiplanar CT image reconstructions and MIPs were
obtained to evaluate the vascular anatomy.
CONTRAST:  100 cc Isovue 370 IV

[Series 5: thins · axial · 0.60mm/px · z∈[-602,-342]mm · 16 of 286 slices shown]
[im 13/286  lung]
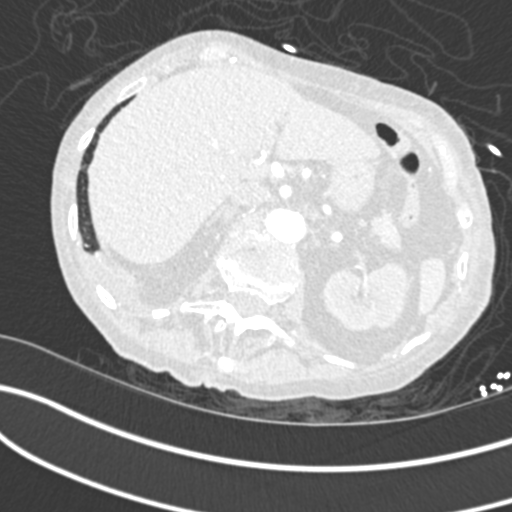
[im 38/286  soft-tissue]
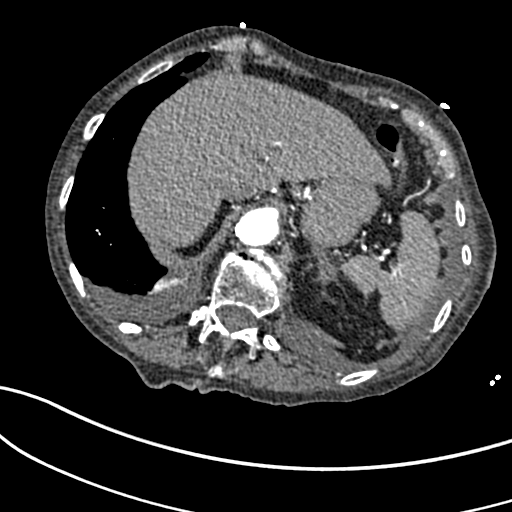
[im 50/286  lung]
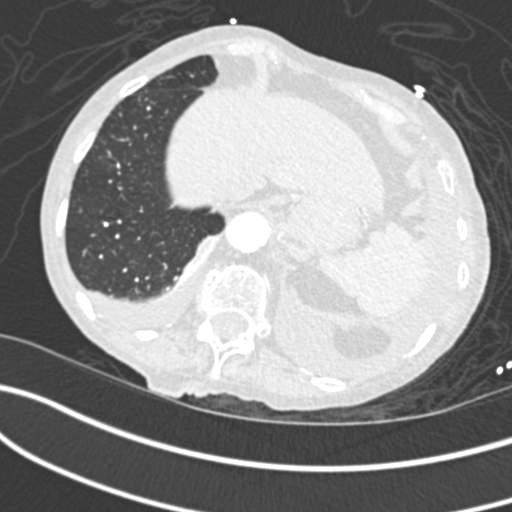
[im 62/286  soft-tissue]
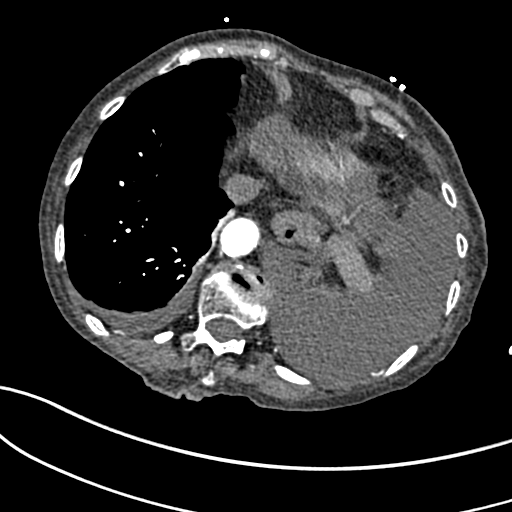
[im 87/286  lung]
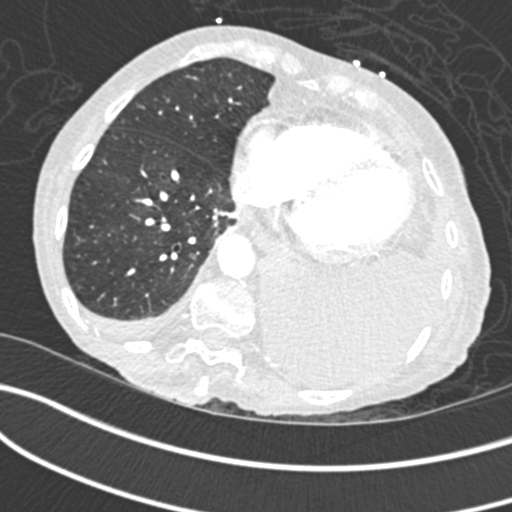
[im 100/286  soft-tissue]
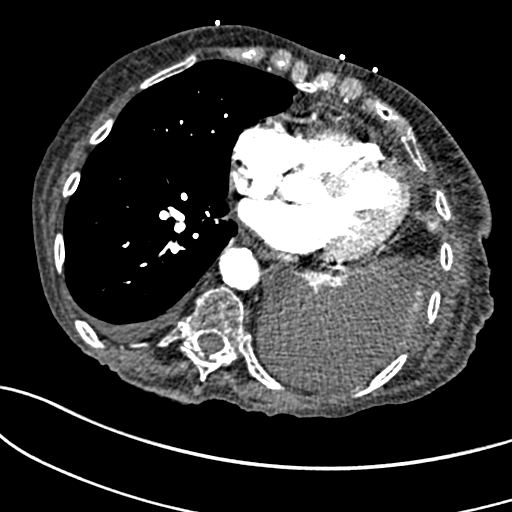
[im 112/286  lung]
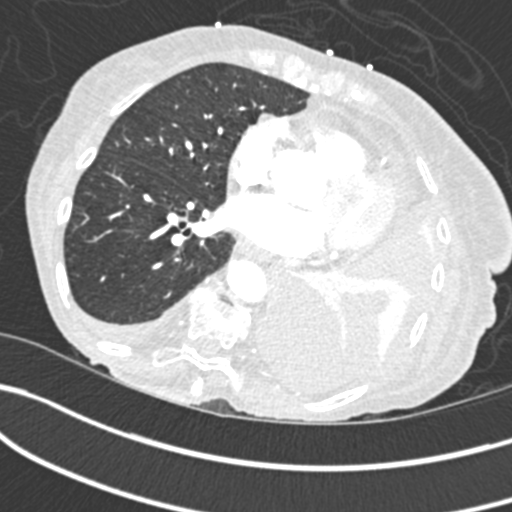
[im 137/286  soft-tissue]
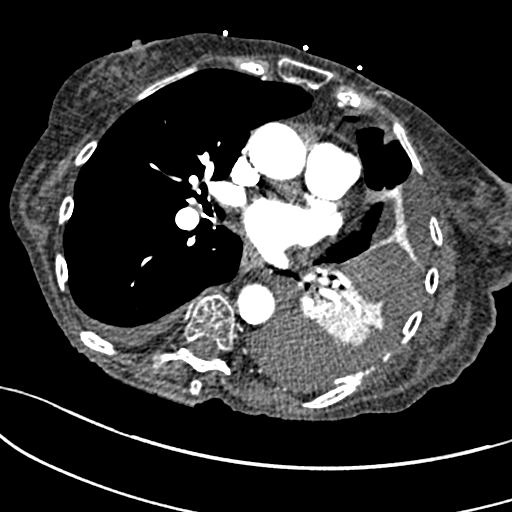
[im 149/286  lung]
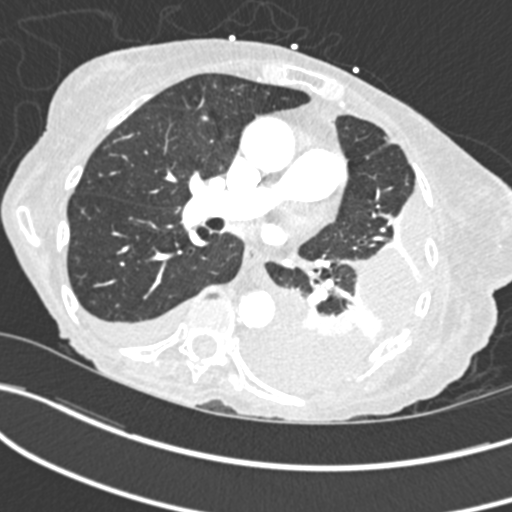
[im 174/286  soft-tissue]
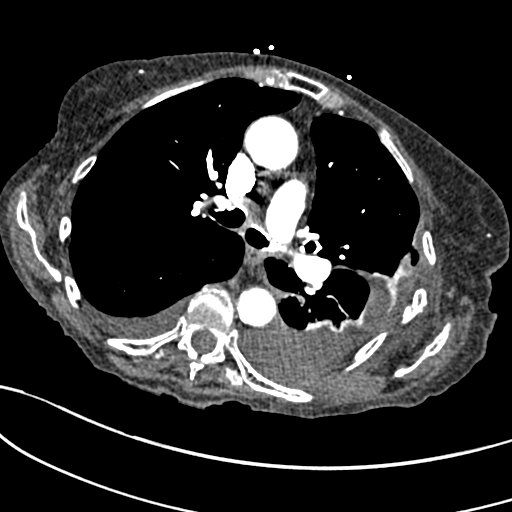
[im 186/286  lung]
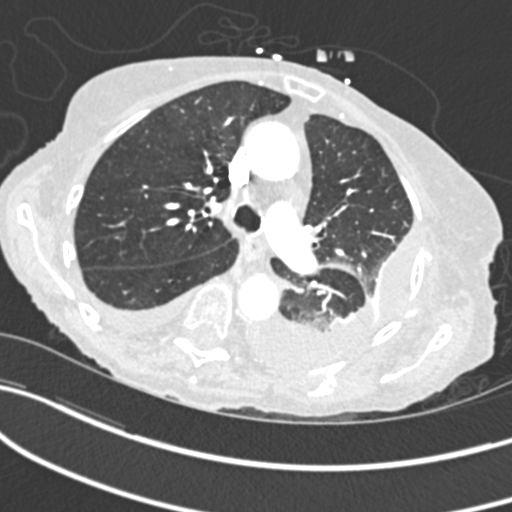
[im 199/286  soft-tissue]
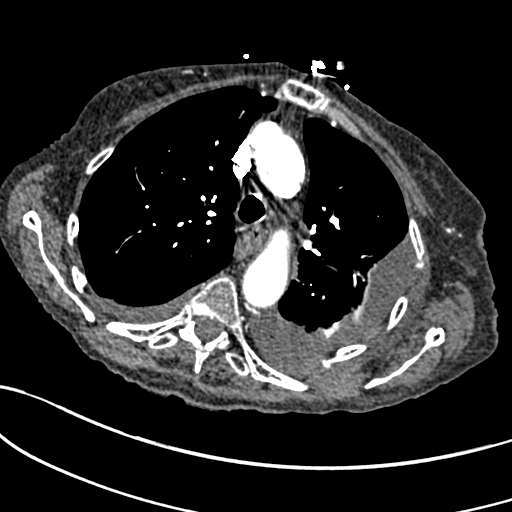
[im 224/286  lung]
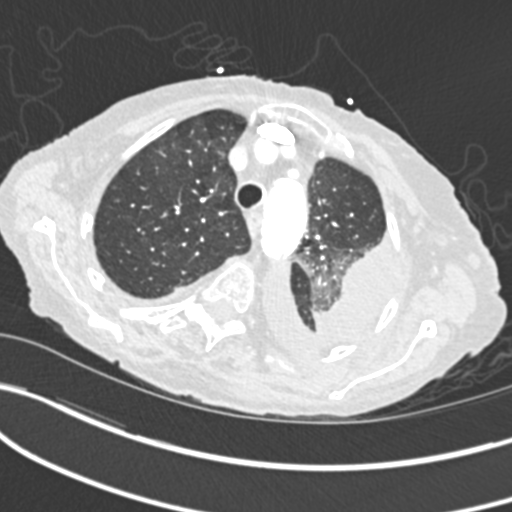
[im 236/286  soft-tissue]
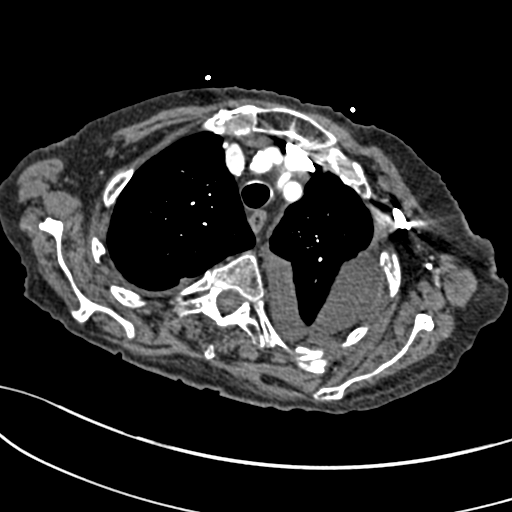
[im 248/286  lung]
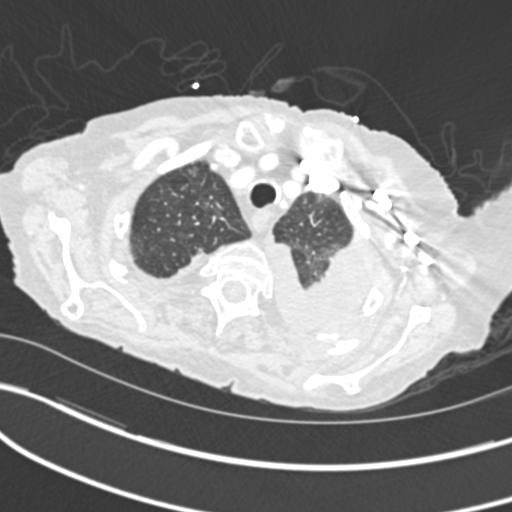
[im 273/286  soft-tissue]
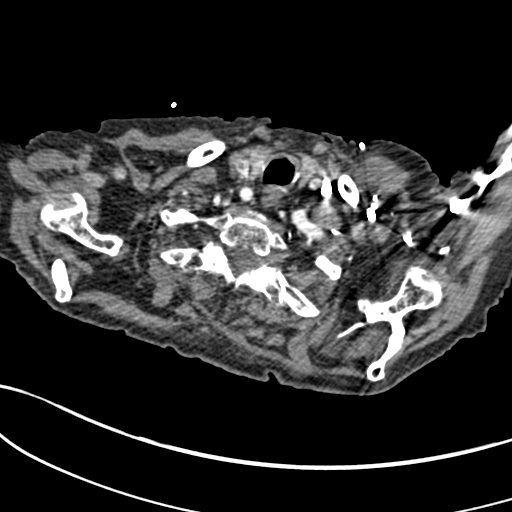

[Series 7: coronal mpr · coronal · 0.56mm/px · 3 of 120 slices shown]
[im 30/120  soft-tissue]
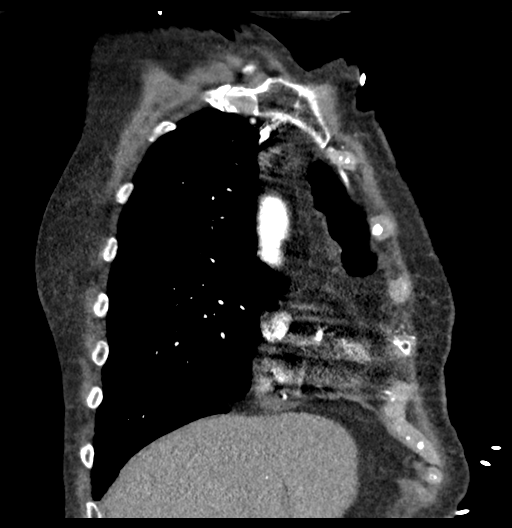
[im 60/120  soft-tissue]
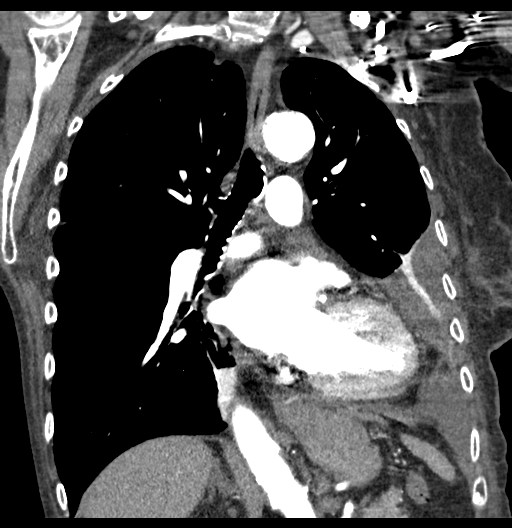
[im 90/120  soft-tissue]
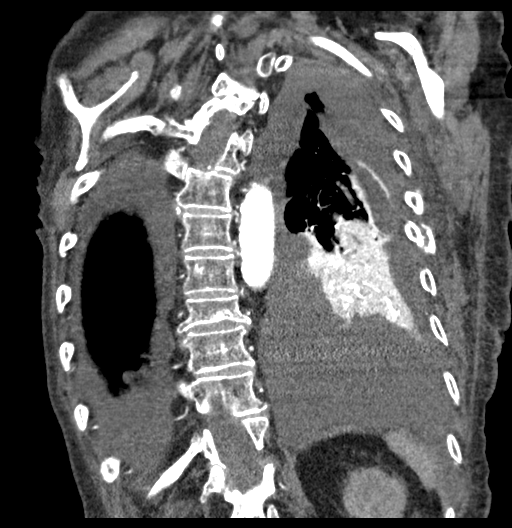

[19 of 46 positions shown; findings below may reference images not displayed]

FINDINGS: Cardiovascular: No filling defects in the pulmonary arteries to
suggest pulmonary emboli. Heart is normal size. Coronary artery and
aortic calcifications. No evidence of aortic aneurysm or dissection.

Mediastinum/Nodes: No mediastinal, hilar, or axillary adenopathy.

Lungs/Pleura: Previously seen left pneumothorax has resolved. There
is a large left pleural effusion, increasing since prior CT.
Compressive atelectasis in the lingula and left lower lobe. Multiple
left-sided rib fractures are again noted.

Small right pleural effusion, new since prior study. No confluent
opacity on the right.

Upper Abdomen: Imaging into the upper abdomen shows no acute
findings.

Musculoskeletal: Chest wall soft tissues are unremarkable. Multiple
left rib fractures are again noted. Stable mild compression
fractures in the lower thoracic spine and upper lumbar spine. No
acute bony abnormality.

Review of the MIP images confirms the above findings.
IMPRESSION: Large left pleural effusion and small right pleural effusion,
increased since prior study. Compressive atelectasis in the lingula
and left lower lobe.

Left pneumothorax seen on prior CT has resolved.

No evidence of pulmonary embolus.

Coronary artery disease, aortic atherosclerosis.

## 2018-05-23 NOTE — Telephone Encounter (Signed)
Palliative care in place see chart.

## 2018-05-26 ENCOUNTER — Ambulatory Visit: Payer: Medicare PPO | Admitting: Family Medicine

## 2018-06-04 DEATH — deceased

## 2018-07-10 ENCOUNTER — Encounter

## 2018-07-10 ENCOUNTER — Ambulatory Visit: Payer: Medicare PPO | Admitting: Cardiovascular Disease
